# Patient Record
Sex: Male | Born: 1970 | Race: Black or African American | Hispanic: No | Marital: Married | Smoking: Never smoker
Health system: Southern US, Community
[De-identification: ages and names within clinical notes are randomized; demographics above are authoritative.]

## PROBLEM LIST (undated history)

## (undated) ENCOUNTER — Ambulatory Visit (HOSPITAL_COMMUNITY): Source: Home / Self Care

## (undated) DIAGNOSIS — G4730 Sleep apnea, unspecified: Secondary | ICD-10-CM

## (undated) DIAGNOSIS — E785 Hyperlipidemia, unspecified: Secondary | ICD-10-CM

## (undated) DIAGNOSIS — R7989 Other specified abnormal findings of blood chemistry: Secondary | ICD-10-CM

## (undated) DIAGNOSIS — K649 Unspecified hemorrhoids: Secondary | ICD-10-CM

## (undated) DIAGNOSIS — G4733 Obstructive sleep apnea (adult) (pediatric): Principal | ICD-10-CM

## (undated) DIAGNOSIS — T7840XA Allergy, unspecified, initial encounter: Secondary | ICD-10-CM

## (undated) DIAGNOSIS — I10 Essential (primary) hypertension: Secondary | ICD-10-CM

## (undated) HISTORY — DX: Obstructive sleep apnea (adult) (pediatric): G47.33

## (undated) HISTORY — DX: Allergy, unspecified, initial encounter: T78.40XA

## (undated) HISTORY — DX: Hyperlipidemia, unspecified: E78.5

## (undated) HISTORY — DX: Unspecified hemorrhoids: K64.9

## (undated) HISTORY — DX: Sleep apnea, unspecified: G47.30

## (undated) HISTORY — PX: COLONOSCOPY: SHX174

## (undated) HISTORY — DX: Other specified abnormal findings of blood chemistry: R79.89

## (undated) HISTORY — DX: Gilbert syndrome: E80.4

---

## 2006-12-14 ENCOUNTER — Emergency Department (HOSPITAL_COMMUNITY): Admission: EM | Admit: 2006-12-14 | Discharge: 2006-12-14 | Payer: Self-pay | Admitting: Emergency Medicine

## 2006-12-17 ENCOUNTER — Encounter (HOSPITAL_COMMUNITY): Admission: RE | Admit: 2006-12-17 | Discharge: 2007-03-17 | Payer: Self-pay | Admitting: Emergency Medicine

## 2007-01-11 ENCOUNTER — Emergency Department (HOSPITAL_COMMUNITY): Admission: EM | Admit: 2007-01-11 | Discharge: 2007-01-11 | Payer: Self-pay | Admitting: Emergency Medicine

## 2012-07-29 ENCOUNTER — Encounter: Payer: Self-pay | Admitting: Gastroenterology

## 2012-08-17 ENCOUNTER — Ambulatory Visit: Payer: Self-pay | Admitting: Gastroenterology

## 2012-08-24 ENCOUNTER — Encounter: Payer: Self-pay | Admitting: Gastroenterology

## 2012-08-24 ENCOUNTER — Ambulatory Visit (INDEPENDENT_AMBULATORY_CARE_PROVIDER_SITE_OTHER): Payer: 59 | Admitting: Gastroenterology

## 2012-08-24 VITALS — BP 124/74 | HR 76 | Ht 66.0 in | Wt 177.1 lb

## 2012-08-24 DIAGNOSIS — Z800 Family history of malignant neoplasm of digestive organs: Secondary | ICD-10-CM

## 2012-08-24 MED ORDER — PEG-KCL-NACL-NASULF-NA ASC-C 100 G PO SOLR: 1.0000 | Freq: Once | ORAL | Status: DC

## 2012-08-24 NOTE — Patient Instructions (Addendum)
You have been scheduled for a colonoscopy with propofol. Please follow written instructions given to you at your visit today.  Please pick up your prep kit at the pharmacy within the next 1-3 days. If you use inhalers (even only as needed), please bring them with you on the day of your procedure.  Thank you for choosing me and [facility].  Venita Lick. Pleas Koch., MD., Clementeen Graham  cc: Tracey Harries, MD

## 2012-08-24 NOTE — Progress Notes (Signed)
History of Present Illness: This is a 42 year old male with a family history of colon cancer. His father was diagnosed with colon cancer at age 43 and underwent a colostomy. Eventually he had metastatic disease to the liver and passed away. Patient is the youngest of 6 children and none of his siblings at colonoscopy. He notes a mild constipation since beginning Pravachol. He occasionally has some hemorrhoid symptoms of mild disomfort and swelling that responded quickly to Preparation H. Denies weight loss, abdominal pain, diarrhea, change in stool caliber, melena, hematochezia, nausea, vomiting, dysphagia, reflux symptoms, chest pain.  Review of Systems: Pertinent positive and negative review of systems were noted in the above HPI section. All other review of systems were otherwise negative.  Current Medications, Allergies, Past Medical History, Past Surgical History, Family History and Social History were reviewed in [employer] record.  Physical Exam: General: Well developed , well nourished, no acute distress Head: Normocephalic and atraumatic Eyes:  sclerae anicteric, EOMI Ears: Normal auditory acuity Mouth: No deformity or lesions Neck: Supple, no masses or thyromegaly Lungs: Clear throughout to auscultation Heart: Regular rate and rhythm; no murmurs, rubs or bruits Abdomen: Soft, non tender and non distended. No masses, hepatosplenomegaly or hernias noted. Normal Bowel sounds Rectal: Deferred to colonoscopy Musculoskeletal: Symmetrical with no gross deformities  Skin: No lesions on visible extremities Pulses:  Normal pulses noted Extremities: No clubbing, cyanosis, edema or deformities noted Neurological: Alert oriented x 4, grossly nonfocal Cervical Nodes:  No significant cervical adenopathy Inguinal Nodes: No significant inguinal adenopathy Psychological:  Alert and cooperative. Normal mood and affect  Assessment and Recommendations:  1. Family history of  colon cancer in his father at age 81. Schedule colonoscopy for higher risk colorectal cancer screening. I advised him to advise his siblings to proceed with colon cancer screening.  The risks, benefits, and alternatives to colonoscopy with possible biopsy and possible polypectomy were discussed with the patient and they consent to proceed.

## 2012-08-31 ENCOUNTER — Encounter: Payer: Self-pay | Admitting: Gastroenterology

## 2012-08-31 ENCOUNTER — Encounter: Payer: Self-pay | Admitting: *Deleted

## 2012-08-31 ENCOUNTER — Ambulatory Visit (AMBULATORY_SURGERY_CENTER): Payer: 59 | Admitting: Gastroenterology

## 2012-08-31 VITALS — BP 133/79 | HR 60 | Temp 96.8°F | Resp 21 | Ht 66.0 in | Wt 177.0 lb

## 2012-08-31 DIAGNOSIS — Z800 Family history of malignant neoplasm of digestive organs: Secondary | ICD-10-CM

## 2012-08-31 DIAGNOSIS — Z1211 Encounter for screening for malignant neoplasm of colon: Secondary | ICD-10-CM

## 2012-08-31 DIAGNOSIS — D126 Benign neoplasm of colon, unspecified: Secondary | ICD-10-CM

## 2012-08-31 MED ORDER — SODIUM CHLORIDE 0.9 % IV SOLN: 500.0000 mL | INTRAVENOUS | Status: DC

## 2012-08-31 NOTE — Progress Notes (Signed)
Called to room to assist during endoscopic procedure.  Patient ID and intended procedure confirmed with present staff. Received instructions for my participation in the procedure from the performing physician.  

## 2012-08-31 NOTE — Patient Instructions (Addendum)

## 2012-08-31 NOTE — Progress Notes (Signed)
Patient did not experience any of the following events: a burn prior to discharge; a fall within the facility; wrong site/side/patient/procedure/implant event; or a hospital transfer or hospital admission upon discharge from the facility. (G8907) Patient did not have preoperative order for IV antibiotic SSI prophylaxis. (G8918)  

## 2012-08-31 NOTE — Op Note (Signed)
[facility] [address] [place], [address]   COLONOSCOPY PROCEDURE REPORT  PATIENT: Vincent Rodriguez, Vincent Rodriguez  MR#: [id number] BIRTHDATE: April 29, 1971 , 41  yrs. old GENDER: Male ENDOSCOPIST: Meryl Dare, MD, [facility] REFERRED XB:JYNWG Bouska, M.D. PROCEDURE DATE:  08/31/2012 PROCEDURE:   Colonoscopy with snare polypectomy ASA CLASS:   Class II INDICATIONS:elevated risk screening and patient's immediate family history of colon cancer. MEDICATIONS: MAC sedation, administered by CRNA and propofol (Diprivan) 300mg  IV DESCRIPTION OF PROCEDURE:   After the risks benefits and alternatives of the procedure were thoroughly explained, informed consent was obtained.  A digital rectal exam revealed no abnormalities of the rectum.   The LB CF-[id number]  endoscope was introduced through the anus and advanced to the cecum, which was identified by both the appendix and ileocecal valve. No adverse events experienced.   The quality of the prep was excellent, using MoviPrep  The instrument was then slowly withdrawn as the colon was fully examined.  COLON FINDINGS: A sessile polyp measuring 7 mm in size was found at the cecum.  A polypectomy was performed with a cold snare.  The resection was complete and the polyp tissue was completely retrieved. The colon was otherwise normal. There was no diverticulosis, inflammation, polyps or cancers unless previously stated.  Retroflexed views revealed no abnormalities. The time to cecum=1 minutes 08 seconds.  Withdrawal time=8 minutes 18 seconds. The scope was withdrawn and the procedure completed.  COMPLICATIONS: There were no complications.  ENDOSCOPIC IMPRESSION: 1.   Sessile polyp measuring 7 mm found at the cecum; polypectomy performed with a cold snare 2.   The colon was otherwise normal  RECOMMENDATIONS: 1.  Await pathology results 2.  Repeat Colonoscopy in 5 years  eSigned:  Meryl Dare, MD, [facility] 08/31/2012 3:06  PM

## 2012-09-01 ENCOUNTER — Telehealth: Payer: Self-pay

## 2012-09-01 NOTE — Telephone Encounter (Signed)
  Follow up Call-  Call back number 08/31/2012  Post procedure Call Back phone  # [phone]  Permission to leave phone message Yes     Patient questions:  Do you have a fever, pain , or abdominal swelling? no Pain Score  0 *  Have you tolerated food without any problems? yes  Have you been able to return to your normal activities? yes  Do you have any questions about your discharge instructions: Diet   no Medications  no Follow up visit  no  Do you have questions or concerns about your Care? no  Actions: * If pain score is 4 or above: No action needed, pain <4.

## 2012-09-03 ENCOUNTER — Encounter: Payer: Self-pay | Admitting: Gastroenterology

## 2013-05-27 ENCOUNTER — Institutional Professional Consult (permissible substitution): Payer: Self-pay | Admitting: Neurology

## 2013-06-04 DIAGNOSIS — E291 Testicular hypofunction: Secondary | ICD-10-CM | POA: Insufficient documentation

## 2013-06-04 DIAGNOSIS — Z800 Family history of malignant neoplasm of digestive organs: Secondary | ICD-10-CM | POA: Insufficient documentation

## 2013-06-04 DIAGNOSIS — E785 Hyperlipidemia, unspecified: Secondary | ICD-10-CM | POA: Insufficient documentation

## 2013-06-11 ENCOUNTER — Encounter: Payer: Self-pay | Admitting: *Deleted

## 2013-06-11 ENCOUNTER — Encounter: Payer: Self-pay | Admitting: Neurology

## 2013-06-11 ENCOUNTER — Ambulatory Visit (INDEPENDENT_AMBULATORY_CARE_PROVIDER_SITE_OTHER): Payer: 59 | Admitting: Neurology

## 2013-06-11 ENCOUNTER — Encounter (INDEPENDENT_AMBULATORY_CARE_PROVIDER_SITE_OTHER): Payer: Self-pay

## 2013-06-11 VITALS — BP 136/98 | HR 72 | Temp 98.0°F | Ht 66.5 in | Wt 180.0 lb

## 2013-06-11 DIAGNOSIS — G4733 Obstructive sleep apnea (adult) (pediatric): Secondary | ICD-10-CM

## 2013-06-11 NOTE — Progress Notes (Signed)
Subjective:    Patient ID: Vincent Rodriguez is a 43 y.o. male.  HPI    Vincent Rodriguez [facility] [address] [place], [place] [address]  Dear Dr. Coletta Rodriguez,   I saw your patient, Vincent Rodriguez, upon your kind request in my neurologic clinic today for initial consultation of his sleep disorder, in particular, concern for obstructive sleep apnea. The patient is unaccompanied today. As you know, Vincent Rodriguez is a very friendly 43 year old right-handed gentleman with an underlying medical history of hyperlipidemia, who per wife is reported to snore and have apneic pauses while asleep. 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 He drives a Scientific laboratory technician. He is a non-smoker. He does not drink EtOH. He tries to sleep longer on WEs.   His typical bedtime is reported to be around 1 AM and usual wake time is around 5:40 AM. Sleep onset typically occurs within minutes. He reports feeling marginally rested upon awakening. He wakes up on an average 3 or 4 times in the middle of the night and has to go to the bathroom 2 times on a typical night. He admits to rare morning headaches.  He reports excessive daytime somnolence (EDS) and His Epworth Sleepiness Score (ESS) is 8/24 today. He has not fallen asleep while driving. The patient has not been taking a planned nap, but tries to nap on the weekend.  He has been known to snore for the past few years. Snoring is reportedly marked, and associated with choking sounds and witnessed apneas. The patient denies a sense of choking or strangling feeling. There is no report of nighttime reflux, with no nighttime cough experienced. The patient has not noted any RLS symptoms and is not known to kick while asleep or before falling asleep. There is no family history of RLS or OSA.  He is not a very restless  sleeper.   He denies cataplexy, sleep paralysis, hypnagogic or hypnopompic hallucinations, or sleep attacks. He does not report any vivid dreams, nightmares, dream enactments, or parasomnias, such as sleep talking or sleep walking. The patient has not had a sleep study or a home sleep test.  He consumes 0 caffeinated beverages per day.  His bedroom is usually dark and cool. There is no TV in the bedroom.   His Past Medical History Is Significant For: Past Medical History  Diagnosis Date  . HLD (hyperlipidemia)   . Gilbert syndrome   . Low testosterone   . Hemorrhoids     His Past Surgical History Is Significant For: History reviewed. No pertinent past surgical history.  His Family History Is Significant For: Family History  Problem Relation Age of Onset  . Colon cancer Father 67  . Liver cancer Father   . Stroke Mother   . Hypertension Mother   . Diabetes Brother     x 2  . Stroke Brother   . Hypertension Sister     His Social History Is Significant For: History   Social History  . Marital Status: Single    Spouse Name: N/A    Number of Children: 0  . Years of Education: N/A   Occupational History  . street [employer] Unemployed   Social History Main Topics  . Smoking status: Never Smoker   . Smokeless tobacco: Never Used  .  Alcohol Use: No  . Drug Use: No  . Sexual Activity: None   Other Topics Concern  . None   Social History Narrative  . None    His Allergies Are:  No Known Allergies:   His Current Medications Are:  Outpatient Encounter Prescriptions as of 06/11/2013  Medication Sig  . meloxicam (MOBIC) 15 MG tablet Take 1 tablet by mouth daily.  . Nutritional Supplements (CONCEPTIONXR REPRODUCTIVE) TABS Take 2 tablets by mouth 2 (two) times daily.  . pravastatin (PRAVACHOL) 80 MG tablet Take 80 mg by mouth daily.   . [DISCONTINUED] clomiPHENE (CLOMID) 50 MG tablet Take 50 mg by mouth daily.  . [DISCONTINUED] VIAGRA 50 MG tablet Take 50 mg by mouth  as needed.   :  Review of Systems:  Out of a complete 14 point review of systems, all are reviewed and negative with the exception of these symptoms as listed below:  Review of Systems  Constitutional: Negative.   HENT: Negative.   Eyes: Negative.   Respiratory:       Snoring  Cardiovascular: Negative.   Gastrointestinal: Negative.   Endocrine: Negative.   Genitourinary: Negative.   Musculoskeletal: Negative.   Skin: Negative.   Allergic/Immunologic: Negative.   Neurological: Negative.   Hematological: Negative.   Psychiatric/Behavioral: Sleep disturbance: snoring.    Objective:  Neurologic Exam  Physical Exam Physical Examination:   Filed Vitals:   06/11/13 0922  BP: 136/98  Pulse: 72  Temp: 98 F (36.7 C)    General Examination: The patient is a very pleasant 43 y.o. male in no acute distress. He appears well-developed and well-nourished and adequately groomed.   HEENT: Normocephalic, atraumatic, pupils are equal, round and reactive to light and accommodation. Funduscopic exam is normal with sharp disc margins noted. Extraocular tracking is good without limitation to gaze excursion or nystagmus noted. Normal smooth pursuit is noted. Hearing is grossly intact. Tympanic membranes are clear bilaterally. Face is symmetric with normal facial animation and normal facial sensation. Speech is clear with no dysarthria noted. There is no hypophonia. There is no lip, neck/head, jaw or voice tremor. Neck is supple with full range of passive and active motion. There are no carotid bruits on auscultation. Oropharynx exam reveals: mild mouth dryness, adequate dental hygiene and moderate airway crowding, due to wider uvula and longer tongue. Mallampati is class II. Tongue protrudes centrally and palate elevates symmetrically. Tonsils are 1+ in size. Neck size is 16.5 inches. He has a tiny overbite. Nasal inspection reveals no significant nasal mucosal bogginess or redness and no septal  deviation, but mild nasal passage tightness.   Chest: Clear to auscultation without wheezing, rhonchi or crackles noted.  Heart: S1+S2+0, regular and normal without murmurs, rubs or gallops noted.   Abdomen: Soft, non-tender and non-distended with normal bowel sounds appreciated on auscultation.  Extremities: There is no pitting edema in the distal lower extremities bilaterally. Pedal pulses are intact.  Skin: Warm and dry without trophic changes noted. There are no varicose veins.  Musculoskeletal: exam reveals no obvious joint deformities, tenderness or joint swelling or erythema.   Neurologically:  Mental status: The patient is awake, alert and oriented in all 4 spheres. His memory, attention, language and knowledge are appropriate. There is no aphasia, agnosia, apraxia or anomia. Speech is clear with normal prosody and enunciation. Thought process is linear. Mood is congruent and affect is normal.  Cranial nerves are as described above under HEENT exam. In addition, shoulder shrug is normal with equal  shoulder height noted. Motor exam: Normal bulk, strength and tone is noted. There is no drift, tremor or rebound. Romberg is negative. Reflexes are 2+ throughout. Toes are downgoing bilaterally. Fine motor skills are intact with normal finger taps, normal hand movements, normal rapid alternating patting, normal foot taps and normal foot agility.  Cerebellar testing shows no dysmetria or intention tremor on finger to nose testing. Heel to shin is unremarkable bilaterally. There is no truncal or gait ataxia.  Sensory exam is intact to light touch, pinprick, vibration, temperature sense in the upper and lower extremities.  Gait, station and balance are unremarkable. No veering to one side is noted. No leaning to one side is noted. Posture is age-appropriate and stance is narrow based. No problems turning are noted. He turns en bloc. Tandem walk is unremarkable. Intact toe and heel stance is noted.                Assessment and Plan:   In summary, Vincent Rodriguez is a very pleasant 43 y.o.-year old male with a history and physical exam somewhat concerning for obstructive sleep apnea (OSA).   I had a long chat with the patient about my findings and the diagnosis of OSA, its prognosis and treatment options. We talked about medical treatments and non-pharmacological approaches. I explained in particular the risks and ramifications of untreated moderate to severe OSA, especially with respect to developing cardiovascular disease down the Road, including congestive heart failure, difficult to treat hypertension, cardiac arrhythmias, or stroke. Even type 2 diabetes has in part been linked to untreated OSA. We talked about trying to maintain a healthy lifestyle in general, as well as the importance of weight control. I encouraged the patient to eat healthy, exercise daily and keep well hydrated, to keep a scheduled bedtime and wake time routine, to not skip any meals and eat healthy snacks in between meals.  I recommended the following at this time: sleep study with potential positive airway pressure titration.  I explained the sleep test procedure to the patient and also outlined possible surgical and non-surgical treatment options of OSA, including the use of a custom-made dental device, upper airway surgical options, such as pillar implants, radiofrequency surgery, tongue base surgery, and UPPP. I also explained the CPAP treatment option to the patient, who indicated that he would be willing to try CPAP if the need arises. I explained the importance of being compliant with PAP treatment, not only for insurance purposes but primarily to improve His symptoms, and for the patient's long term health benefit, including to reduce His cardiovascular risks. I answered all his questions today and the patient was in agreement. I would like to see him back after the sleep study is completed and encouraged him to call  with any interim questions, concerns, problems or updates.   Thank you very much for allowing me to participate in the care of this nice patient. If I can be of any further assistance to you please do not hesitate to call me at [phone].  Sincerely,   Vincent Rodriguez

## 2013-06-11 NOTE — Patient Instructions (Signed)

## 2013-07-15 ENCOUNTER — Ambulatory Visit (INDEPENDENT_AMBULATORY_CARE_PROVIDER_SITE_OTHER): Payer: 59

## 2013-07-15 DIAGNOSIS — G4733 Obstructive sleep apnea (adult) (pediatric): Secondary | ICD-10-CM

## 2013-07-15 DIAGNOSIS — R9431 Abnormal electrocardiogram [ECG] [EKG]: Secondary | ICD-10-CM

## 2013-07-28 ENCOUNTER — Telehealth: Payer: Self-pay | Admitting: Neurology

## 2013-07-28 NOTE — Telephone Encounter (Signed)
Please call and notify the patient that the recent sleep study did not show any significant degree of obstructive sleep apnea. He has an overall mild degree of obstructive sleep apnea, for which he is encouraged to try to lose weight and try to sleep off his back. Very mild EKG changes were seen as well. Please inform patient that I would like to go over the details of the study during a follow up appointment and if not already previously scheduled, arrange a followup appointment (please utilize a followu-up slot). Also, route or fax report to PCP and referring MD, if other than PCP.  Once you have spoken to patient, you can close this encounter.   Thanks,  Star Age, MD, PhD [facility])

## 2013-07-29 ENCOUNTER — Encounter: Payer: Self-pay | Admitting: *Deleted

## 2013-07-29 NOTE — Telephone Encounter (Signed)
I called and spoke with the patient about his recent sleep study results. I informed the patient that the study did not show any significant degree of obstructive sleep apnea. He has an overall mild degree of obstructive sleep apnea and that Dr. Rexene Alberts recommend he lose weight and try to sleep off his back. Patient EKG showed very mild changes. I informed the patient that Dr. Rexene Alberts would like to discuss the report in detail. Patient has scheduled his appointment on September 03, 2013 at 9:00. I will fax a copy of the report to Dr. Chales Abrahams office and mail a copy to the patient.

## 2013-09-03 ENCOUNTER — Encounter: Payer: Self-pay | Admitting: Neurology

## 2013-09-03 ENCOUNTER — Ambulatory Visit (INDEPENDENT_AMBULATORY_CARE_PROVIDER_SITE_OTHER): Payer: 59 | Admitting: Neurology

## 2013-09-03 ENCOUNTER — Encounter (INDEPENDENT_AMBULATORY_CARE_PROVIDER_SITE_OTHER): Payer: Self-pay

## 2013-09-03 VITALS — BP 132/84 | HR 68 | Temp 98.3°F | Ht 66.5 in

## 2013-09-03 DIAGNOSIS — G4733 Obstructive sleep apnea (adult) (pediatric): Secondary | ICD-10-CM | POA: Insufficient documentation

## 2013-09-03 DIAGNOSIS — E663 Overweight: Secondary | ICD-10-CM

## 2013-09-03 HISTORY — DX: Obstructive sleep apnea (adult) (pediatric): G47.33

## 2013-09-03 NOTE — Patient Instructions (Signed)
Continue on working on a healthy weight and drink more water.  Try to sleep on your sides and try to get about 7 1/2 to 8 hours of sleep.  You have only a very mild degree of sleep apnea and do not require additional treatment other than above.  I can see you back as needed.

## 2013-09-03 NOTE — Progress Notes (Signed)
Subjective:    Patient ID: Vincent Rodriguez is a 43 y.o. male.  HPI    Interim history:   Mr. Vincent Rodriguez is a very friendly 43 year old right-handed gentleman with an underlying medical history of hyperlipidemia, who presents for followup consultation after his recent sleep study. He is unaccompanied today. I first met him on 06/11/2013, which time he reported snoring per wife as well as apneic pauses while asleep. We proceeded with a sleep study. He had a baseline sleep study on 07/15/2013 and I went over his test results with him in detail today. Sleep efficiency was normal at 91.2% with a normal latency to sleep of 14.5 minutes and wake after sleep onset of 23.5 minutes with mild sleep fragmentation noted. He had a normal arousal index. He had an increased percentage of stage II sleep, absence of slow-wave sleep and a normal percentage of REM sleep with a prolonged REM latency of 167 minutes. Rare PACs were seen. He had mild to moderate intermittent snoring. Total AHI was borderline at 5.9 per hour, rising to 12.1 per hour in REM sleep and 9 per hour in the supine position. Baseline oxygen saturation was 95%, nadir was 89%.  Today, he reports, doing better. He sleeps better and quit his part-time job, has been able to play basketball, rests better and feels more energized during the day. He previously slept 4-5 hours, now he sleeps about 7 1/2 to 8 hours. He has no CP, no palpitations, no SOB. He has been able to lose some weight. He had a UTI recently.   His Past Medical History Is Significant For: Past Medical History  Diagnosis Date  . HLD (hyperlipidemia)   . Gilbert syndrome   . Low testosterone   . Hemorrhoids     His Past Surgical History Is Significant For: No past surgical history on file.  His Family History Is Significant For: Family History  Problem Relation Age of Onset  . Colon cancer Father 41  . Liver cancer Father   . Stroke Mother   . Hypertension Mother   .  Diabetes Brother     x 2  . Stroke Brother   . Hypertension Sister     His Social History Is Significant For: History   Social History  . Marital Status: Single    Spouse Name: N/A    Number of Children: 0  . Years of Education: N/A   Occupational History  . street [employer] Unemployed   Social History Main Topics  . Smoking status: Never Smoker   . Smokeless tobacco: Never Used  . Alcohol Use: No  . Drug Use: No  . Sexual Activity: None   Other Topics Concern  . None   Social History Narrative  . None    His Allergies Are:  No Known Allergies:   His Current Medications Are:  Outpatient Encounter Prescriptions as of 09/03/2013  Medication Sig  . meloxicam (MOBIC) 15 MG tablet Take 1 tablet by mouth daily.  . Nutritional Supplements (CONCEPTIONXR REPRODUCTIVE) TABS Take 2 tablets by mouth 2 (two) times daily.  . pravastatin (PRAVACHOL) 80 MG tablet Take 80 mg by mouth daily.   :  Review of Systems:  Out of a complete 14 point review of systems, all are reviewed and negative with the exception of these symptoms as listed below:  Review of Systems  Constitutional: Negative.   HENT: Negative.   Eyes: Negative.   Respiratory: Negative.   Cardiovascular: Negative.   Gastrointestinal: Negative.  Endocrine: Negative.   Genitourinary: Negative.   Musculoskeletal: Negative.   Skin: Negative.   Allergic/Immunologic: Negative.   Neurological: Negative.   Hematological: Negative.   Psychiatric/Behavioral: Negative.   All other systems reviewed and are negative.   Objective:  Neurologic Exam  Physical Exam Physical Examination:   Filed Vitals:   09/03/13 0858  BP: 132/84  Pulse: 68  Temp: 98.3 F (36.8 C)     General Examination: The patient is a very pleasant 43 y.o. male in no acute distress. He appears well-developed and well-nourished and adequately groomed.   HEENT: Normocephalic, atraumatic, pupils are equal, round and reactive to light and  accommodation. Funduscopic exam is normal with sharp disc margins noted. Extraocular tracking is good without limitation to gaze excursion or nystagmus noted. Normal smooth pursuit is noted. Hearing is grossly intact. Face is symmetric with normal facial animation and normal facial sensation. Speech is clear with no dysarthria noted. There is no hypophonia. There is no lip, neck/head, jaw or voice tremor. Neck is supple with full range of passive and active motion. There are no carotid bruits on auscultation. Oropharynx exam reveals: mild mouth dryness, adequate dental hygiene and moderate airway crowding, due to wider uvula and longer tongue. Mallampati is class II. Tongue protrudes centrally and palate elevates symmetrically. Tonsils are 1+ in size.   Chest: Clear to auscultation without wheezing, rhonchi or crackles noted.  Heart: S1+S2+0, regular and normal without murmurs, rubs or gallops noted.   Abdomen: Soft, non-tender and non-distended with normal bowel sounds appreciated on auscultation.  Extremities: There is no pitting edema in the distal lower extremities bilaterally. Pedal pulses are intact.  Skin: Warm and dry without trophic changes noted. There are no varicose veins.  Musculoskeletal: exam reveals no obvious joint deformities, tenderness or joint swelling or erythema.   Neurologically:  Mental status: The patient is awake, alert and oriented in all 4 spheres. His memory, attention, language and knowledge are appropriate. There is no aphasia, agnosia, apraxia or anomia. Speech is clear with normal prosody and enunciation. Thought process is linear. Mood is congruent and affect is normal.  Cranial nerves are as described above under HEENT exam. In addition, shoulder shrug is normal with equal shoulder height noted. Motor exam: Normal bulk, strength and tone is noted. There is no drift, tremor or rebound. Romberg is negative. Reflexes are 2+ throughout. Toes are downgoing bilaterally.  Fine motor skills are intact.  Cerebellar testing shows no dysmetria or intention tremor on finger to nose testing. There is no truncal or gait ataxia.  Sensory exam is intact to light touch, pinprick, vibration, temperature sense in the upper and lower extremities.  Gait, station and balance are unremarkable. No veering to one side is noted. No leaning to one side is noted. Posture is age-appropriate and stance is narrow based. No problems turning are noted. He turns en bloc. Tandem walk is unremarkable. Intact toe and heel stance is noted.               Assessment and Plan:   In summary, KAGE WILLMANN is a very pleasant 43 y.o.-year old male with an underlying medical history of hyperlipidemia, who presents for followup consultation after his recent sleep study. His sleep study was explained to him in detail today. I also provided him with a copy of that today. His exam is nonfocal. Essentially, he has borderline or very mild obstructive sleep apnea for which weight loss and staying off his bike should be enough  as far as treatment. He actually has been able to lose weight and since he stopped his second job he feels much better overall. He sleeps more hours and feels better rested, he has more daytime energy and his wife also reports that he does not have any problems breathing at night. At this juncture, I have advised him to continue to strive towards a better BMI of 25 or less. He is advised to drink more water. He is also advised to try to sleep on his sides. I can see him back on an as-needed basis. He was in agreement.

## 2013-11-20 ENCOUNTER — Encounter (HOSPITAL_COMMUNITY): Payer: Self-pay | Admitting: Emergency Medicine

## 2013-11-20 ENCOUNTER — Emergency Department (HOSPITAL_COMMUNITY)
Admission: EM | Admit: 2013-11-20 | Discharge: 2013-11-20 | Discharge status: Against Medical Advice | Payer: 59 | Attending: Emergency Medicine | Admitting: Emergency Medicine

## 2013-11-20 DIAGNOSIS — N390 Urinary tract infection, site not specified: Secondary | ICD-10-CM | POA: Insufficient documentation

## 2013-11-20 DIAGNOSIS — G4733 Obstructive sleep apnea (adult) (pediatric): Secondary | ICD-10-CM

## 2013-11-20 LAB — URINALYSIS, ROUTINE W REFLEX MICROSCOPIC
BILIRUBIN URINE: NEGATIVE
Glucose, UA: NEGATIVE mg/dL
Hgb urine dipstick: NEGATIVE
Ketones, ur: NEGATIVE mg/dL
Leukocytes, UA: NEGATIVE
NITRITE: NEGATIVE
Protein, ur: NEGATIVE mg/dL
Specific Gravity, Urine: 1.03 (ref 1.005–1.030)
UROBILINOGEN UA: 1 mg/dL (ref 0.0–1.0)
pH: 5.5 (ref 5.0–8.0)

## 2013-11-20 NOTE — ED Notes (Signed)
Pt told staff that he was going to leave because he felt better now. RN convinced pt to wait til he was seen by EDP before leaving. Pt agreed.

## 2013-11-20 NOTE — ED Notes (Signed)
EDP and RN went to room to assess pt. Pt was not in room. Pt gown on bed and monitor equipment lying on the bed. Pt nowhere to be found. Pt left without being seen by EDP

## 2013-11-20 NOTE — ED Notes (Signed)
The pt is c/o urinary frequency for approx one week.  Now he has back pain also.  He thinks he has a uti

## 2014-06-29 DIAGNOSIS — Z0000 Encounter for general adult medical examination without abnormal findings: Secondary | ICD-10-CM | POA: Insufficient documentation

## 2014-12-04 ENCOUNTER — Encounter (HOSPITAL_COMMUNITY): Payer: Self-pay | Admitting: Emergency Medicine

## 2014-12-04 ENCOUNTER — Emergency Department (HOSPITAL_COMMUNITY)
Admission: EM | Admit: 2014-12-04 | Discharge: 2014-12-04 | Disposition: A | Discharge status: Home / Self Care | Payer: 59 | Attending: Emergency Medicine | Admitting: Emergency Medicine

## 2014-12-04 DIAGNOSIS — Z8669 Personal history of other diseases of the nervous system and sense organs: Secondary | ICD-10-CM | POA: Insufficient documentation

## 2014-12-04 DIAGNOSIS — Z8719 Personal history of other diseases of the digestive system: Secondary | ICD-10-CM | POA: Insufficient documentation

## 2014-12-04 DIAGNOSIS — E785 Hyperlipidemia, unspecified: Secondary | ICD-10-CM | POA: Insufficient documentation

## 2014-12-04 DIAGNOSIS — Z79899 Other long term (current) drug therapy: Secondary | ICD-10-CM | POA: Diagnosis not present

## 2014-12-04 DIAGNOSIS — N4889 Other specified disorders of penis: Secondary | ICD-10-CM | POA: Diagnosis not present

## 2014-12-04 LAB — CBC WITH DIFFERENTIAL/PLATELET
Basophils Absolute: 0 10*3/uL (ref 0.0–0.1)
Basophils Relative: 0 % (ref 0–1)
Eosinophils Absolute: 0 10*3/uL (ref 0.0–0.7)
Eosinophils Relative: 1 % (ref 0–5)
HCT: 37.6 % — ABNORMAL LOW (ref 39.0–52.0)
HEMOGLOBIN: 13.5 g/dL (ref 13.0–17.0)
LYMPHS PCT: 40 % (ref 12–46)
Lymphs Abs: 1.7 10*3/uL (ref 0.7–4.0)
MCH: 30.9 pg (ref 26.0–34.0)
MCHC: 35.9 g/dL (ref 30.0–36.0)
MCV: 86 fL (ref 78.0–100.0)
MONO ABS: 0.3 10*3/uL (ref 0.1–1.0)
MONOS PCT: 8 % (ref 3–12)
NEUTROS ABS: 2.2 10*3/uL (ref 1.7–7.7)
Neutrophils Relative %: 51 % (ref 43–77)
Platelets: 133 10*3/uL — ABNORMAL LOW (ref 150–400)
RBC: 4.37 MIL/uL (ref 4.22–5.81)
RDW: 12.6 % (ref 11.5–15.5)
WBC: 4.2 10*3/uL (ref 4.0–10.5)

## 2014-12-04 LAB — URINALYSIS, ROUTINE W REFLEX MICROSCOPIC
Bilirubin Urine: NEGATIVE
GLUCOSE, UA: NEGATIVE mg/dL
Hgb urine dipstick: NEGATIVE
Ketones, ur: NEGATIVE mg/dL
LEUKOCYTES UA: NEGATIVE
Nitrite: NEGATIVE
PROTEIN: NEGATIVE mg/dL
Specific Gravity, Urine: 1.015 (ref 1.005–1.030)
UROBILINOGEN UA: 1 mg/dL (ref 0.0–1.0)
pH: 7.5 (ref 5.0–8.0)

## 2014-12-04 LAB — BASIC METABOLIC PANEL
Anion gap: 7 (ref 5–15)
BUN: 14 mg/dL (ref 6–20)
CALCIUM: 9 mg/dL (ref 8.9–10.3)
CO2: 28 mmol/L (ref 22–32)
Chloride: 105 mmol/L (ref 101–111)
Creatinine, Ser: 1.16 mg/dL (ref 0.61–1.24)
GFR calc non Af Amer: >60 mL/min (ref >60)
GLUCOSE: 103 mg/dL — AB (ref 65–99)
Potassium: 3.5 mmol/L (ref 3.5–5.1)
SODIUM: 140 mmol/L (ref 135–145)

## 2014-12-04 NOTE — ED Provider Notes (Signed)
CSN: [id number]     Arrival date & time 12/04/14  1428 History   First MD Initiated Contact with Patient 12/04/14 1631     Chief Complaint  Patient presents with  . Abdominal Pain  . Penis Pain   (Consider location/radiation/quality/duration/timing/severity/associated sxs/prior Treatment) HPI  Patient is a 44 year old male with a history of low testosterone, Gilbert syndrome, HLD presented today for swelling of the left side of the penis. Patient reports this began yesterday and has been intermittent. It began after he was swimming in a pool yesterday. He denies any trauma to his penis. Denies any discharge, dysuria, fevers, chills, nausea, vomiting. He has had lower abdominal pain but reports she did have exercises recently. Reports he is still passing gas and urinating without issue. Denies any numbness tingling. Reports he is on a new fertility medication and has follow-up with urology. Reports this is just soft tissue swelling of the left side of the shaft of the penis. He went to sleep last night and it had gone down. Awoke this morning and it had increased again. Denies any previous similar episodes.  Past Medical History  Diagnosis Date  . HLD (hyperlipidemia)   . Gilbert syndrome   . Low testosterone   . Hemorrhoids   . OSA (obstructive sleep apnea) 09/03/2013   History reviewed. No pertinent past surgical history. Family History  Problem Relation Age of Onset  . Colon cancer Father 47  . Liver cancer Father   . Stroke Mother   . Hypertension Mother   . Diabetes Brother     x 2  . Stroke Brother   . Hypertension Sister    History  Substance Use Topics  . Smoking status: Never Smoker   . Smokeless tobacco: Never Used  . Alcohol Use: No    Review of Systems  Constitutional: Negative for fever and chills.  HENT: Negative for congestion and sore throat.   Eyes: Negative for pain.  Respiratory: Negative for cough and shortness of breath.   Cardiovascular: Negative for  chest pain and palpitations.  Gastrointestinal: Positive for abdominal pain. Negative for nausea, vomiting and diarrhea.  Endocrine: Negative.   Genitourinary: Positive for penile swelling. Negative for dysuria, urgency, hematuria, flank pain, discharge, difficulty urinating, genital sores, penile pain and testicular pain.  Musculoskeletal: Negative for back pain and neck pain.  Skin: Negative for rash.  Allergic/Immunologic: Negative.   Neurological: Negative for dizziness, syncope and light-headedness.  Psychiatric/Behavioral: Negative for confusion.   Allergies  Shrimp  Home Medications   Prior to Admission medications   Medication Sig Start Date End Date Taking? Authorizing Provider  aspirin-acetaminophen-caffeine (EXCEDRIN MIGRAINE) [phone] MG per tablet Take 2 tablets by mouth daily as needed for headache.   Yes Historical Provider, MD  clomiPHENE (CLOMID) 50 MG tablet Take 50 mg by mouth daily. 10/07/14  Yes Historical Provider, MD  pravastatin (PRAVACHOL) 80 MG tablet Take 80 mg by mouth daily.  08/07/12  Yes Historical Provider, MD   BP 129/78 mmHg  Pulse 67  Temp(Src) 98.4 F (36.9 C) (Oral)  Resp 18  Wt 172 lb (78.019 kg)  SpO2 99% Physical Exam  Constitutional: He is oriented to person, place, and time. He appears well-developed and well-nourished.  HENT:  Head: Normocephalic and atraumatic.  Eyes: Conjunctivae and EOM are normal. Pupils are equal, round, and reactive to light.  Neck: Normal range of motion. Neck supple.  Cardiovascular: Normal rate, regular rhythm, normal heart sounds and intact distal pulses.   Pulmonary/Chest: Effort  normal and breath sounds normal. No respiratory distress.  Abdominal: Soft. Bowel sounds are normal. There is no tenderness. There is no rigidity, no rebound, no guarding, no CVA tenderness, no tenderness at McBurney's point and negative Murphy's sign. No hernia. Hernia confirmed negative in the right inguinal area and confirmed  negative in the left inguinal area.  Genitourinary: Testes normal. Cremasteric reflex is present. Right testis shows no mass, no swelling and no tenderness. Right testis is descended. Cremasteric reflex is not absent on the right side. No phimosis, paraphimosis, hypospadias, penile erythema or penile tenderness. No discharge found.  Swelling on the left side of the shaft of the penis. No tenderness palpation or surrounding erythema.  Musculoskeletal: Normal range of motion.  Neurological: He is alert and oriented to person, place, and time. He has normal reflexes. No cranial nerve deficit.  Skin: Skin is warm and dry.    ED Course  Procedures (including critical care time) Labs Review Labs Reviewed  CBC WITH DIFFERENTIAL/PLATELET - Abnormal; Notable for the following:    HCT 37.6 (*)    Platelets 133 (*)    All other components within normal limits  BASIC METABOLIC PANEL - Abnormal; Notable for the following:    Glucose, Bld 103 (*)    All other components within normal limits  URINE CULTURE  URINALYSIS, ROUTINE W REFLEX MICROSCOPIC (NOT AT [facility])    Imaging Review No results found.   EKG Interpretation None      MDM   Final diagnoses:  Swelling of penis   Patient is a 44 year old male with a history of low testosterone, given bare syndrome, HLD presented today for swelling of the left side of the penis.   DDX allergic reaction, STI, abscess, cellulitis.  On initial evaluation patient was hemodynamic stable and in no acute distress. Appears evaluated and found no hernia present. Also no frank erythema or fluctuance. Doubt cellulitis or abscess at this time. No ulceration or pattern consistent with STI. Possible inflammatory response or reaction as mild urticaria stranding. Advised patient to take Benadryl. No leukocytosis on labs with no UA abnormalities. Patient with urologist and advised to follow-up with him. A h all readyas appointment scheduled for next Friday and will  follow-up then.  If performed, labs, EKGs, and imaging were reviewed/interpreted by myself and my attending and incorporated into medical decision making.  Discussed pertinent finding with patient or caregiver prior to discharge with no further questions.  Immediate return precautions given and pt or caregiver reports understanding.  Pt care supervised by my attending Dr. Carollee Massed, MD PGY-2  Emergency Medicine     Geronimo Boot, MD 12/05/14 3299  Charlesetta Shanks, MD 12/19/14 1356

## 2014-12-04 NOTE — ED Notes (Signed)
Pt sts lower abd soreness after heavy workout 3 days ago; pt sts swollen area on side of penis x 2 days

## 2014-12-04 NOTE — ED Notes (Signed)
Dr. Liston Alba at bedside

## 2014-12-06 LAB — URINE CULTURE: Culture: NO GROWTH

## 2015-03-25 ENCOUNTER — Encounter (HOSPITAL_COMMUNITY): Payer: Self-pay | Admitting: Emergency Medicine

## 2015-03-25 ENCOUNTER — Emergency Department (HOSPITAL_COMMUNITY)
Admission: EM | Admit: 2015-03-25 | Discharge: 2015-03-25 | Disposition: A | Discharge status: Home / Self Care | Payer: 59 | Attending: Emergency Medicine | Admitting: Emergency Medicine

## 2015-03-25 DIAGNOSIS — R197 Diarrhea, unspecified: Secondary | ICD-10-CM | POA: Diagnosis not present

## 2015-03-25 DIAGNOSIS — Z79899 Other long term (current) drug therapy: Secondary | ICD-10-CM | POA: Diagnosis not present

## 2015-03-25 DIAGNOSIS — Z8669 Personal history of other diseases of the nervous system and sense organs: Secondary | ICD-10-CM | POA: Insufficient documentation

## 2015-03-25 DIAGNOSIS — K625 Hemorrhage of anus and rectum: Secondary | ICD-10-CM | POA: Diagnosis present

## 2015-03-25 DIAGNOSIS — E785 Hyperlipidemia, unspecified: Secondary | ICD-10-CM | POA: Insufficient documentation

## 2015-03-25 LAB — CBC WITH DIFFERENTIAL/PLATELET
Basophils Absolute: 0 10*3/uL (ref 0.0–0.1)
Basophils Relative: 0 %
EOS ABS: 0 10*3/uL (ref 0.0–0.7)
EOS PCT: 0 %
HEMATOCRIT: 41.6 % (ref 39.0–52.0)
HEMOGLOBIN: 14.7 g/dL (ref 13.0–17.0)
Lymphocytes Relative: 37 %
Lymphs Abs: 1.8 10*3/uL (ref 0.7–4.0)
MCH: 30.2 pg (ref 26.0–34.0)
MCHC: 35.3 g/dL (ref 30.0–36.0)
MCV: 85.6 fL (ref 78.0–100.0)
Monocytes Absolute: 0.4 10*3/uL (ref 0.1–1.0)
Monocytes Relative: 9 %
NEUTROS PCT: 54 %
Neutro Abs: 2.6 10*3/uL (ref 1.7–7.7)
Platelets: 147 10*3/uL — ABNORMAL LOW (ref 150–400)
RBC: 4.86 MIL/uL (ref 4.22–5.81)
RDW: 12.5 % (ref 11.5–15.5)
WBC: 4.9 10*3/uL (ref 4.0–10.5)

## 2015-03-25 LAB — BASIC METABOLIC PANEL
Anion gap: 6 (ref 5–15)
BUN: 15 mg/dL (ref 6–20)
CHLORIDE: 108 mmol/L (ref 101–111)
CO2: 27 mmol/L (ref 22–32)
Calcium: 9.3 mg/dL (ref 8.9–10.3)
Creatinine, Ser: 1.2 mg/dL (ref 0.61–1.24)
GFR calc non Af Amer: >60 mL/min (ref >60)
Glucose, Bld: 101 mg/dL — ABNORMAL HIGH (ref 65–99)
POTASSIUM: 3.9 mmol/L (ref 3.5–5.1)
SODIUM: 141 mmol/L (ref 135–145)

## 2015-03-25 LAB — POC OCCULT BLOOD, ED: Fecal Occult Bld: NEGATIVE

## 2015-03-25 MED ORDER — SODIUM CHLORIDE 0.9 % IV BOLUS (SEPSIS)
1000.0000 mL | Freq: Once | INTRAVENOUS | Status: AC
  Administered 2015-03-25: 1000 mL via INTRAVENOUS

## 2015-03-25 NOTE — ED Notes (Signed)
Pt reports onset at 0800 with diarrhea. Pt has another episode of diarrhea at 0900. A third episode of diarrhea around 1100 with drops of blood noted in toilet. This morning pt reports noted blood on tissue when he wiped. Pt denies N/V.

## 2015-03-25 NOTE — ED Provider Notes (Signed)
CSN: [id number]     Arrival date & time 03/25/15  0900 History   First MD Initiated Contact with Patient 03/25/15 0912     Chief Complaint  Patient presents with  . Blood In Stools  . Diarrhea     (Consider location/radiation/quality/duration/timing/severity/associated sxs/prior Treatment) Patient is a 44 y.o. male presenting with diarrhea.  Diarrhea Severity:  Moderate Onset quality:  Gradual Number of episodes:  3 Duration:  1 day Timing:  Constant Progression:  Unchanged Relieved by:  Nothing Worsened by:  Nothing tried Ineffective treatments:  None tried Associated symptoms: abdominal pain (cramping)   Associated symptoms: no chills, no recent cough, no fever, no headaches and no vomiting   Risk factors: suspect food intake (not sure ate out yesterday)   Risk factors: no recent antibiotic use and no sick contacts     Past Medical History  Diagnosis Date  . HLD (hyperlipidemia)   . Gilbert syndrome   . Low testosterone   . Hemorrhoids   . OSA (obstructive sleep apnea) 09/03/2013   History reviewed. No pertinent past surgical history. Family History  Problem Relation Age of Onset  . Colon cancer Father 67  . Liver cancer Father   . Stroke Mother   . Hypertension Mother   . Diabetes Brother     x 2  . Stroke Brother   . Hypertension Sister    Social History  Substance Use Topics  . Smoking status: Never Smoker   . Smokeless tobacco: Never Used  . Alcohol Use: No    Review of Systems  Constitutional: Negative for fever and chills.  HENT: Negative for sore throat.   Eyes: Negative for visual disturbance.  Respiratory: Negative for shortness of breath.   Cardiovascular: Negative for chest pain.  Gastrointestinal: Positive for abdominal pain (cramping) and diarrhea. Negative for vomiting.  Genitourinary: Negative for difficulty urinating.  Musculoskeletal: Negative for back pain and neck stiffness.  Skin: Negative for rash.  Neurological: Negative for  syncope and headaches.      Allergies  Shrimp  Home Medications   Prior to Admission medications   Medication Sig Start Date End Date Taking? Authorizing Provider  aspirin-acetaminophen-caffeine (EXCEDRIN MIGRAINE) [phone] MG per tablet Take 2 tablets by mouth daily as needed for headache.   Yes Historical Provider, MD  clomiPHENE (CLOMID) 50 MG tablet Take 50 mg by mouth daily. 10/07/14  Yes Historical Provider, MD  pravastatin (PRAVACHOL) 80 MG tablet Take 80 mg by mouth daily.  08/07/12   Historical Provider, MD   BP 121/85 mmHg  Pulse 73  Temp(Src) 98.9 F (37.2 C) (Oral)  Resp 18  Ht 5' 5.5" (1.664 m)  Wt 167 lb (75.751 kg)  BMI 27.36 kg/m2  SpO2 100% Physical Exam  Constitutional: He is oriented to person, place, and time. He appears well-developed and well-nourished. No distress.  HENT:  Head: Normocephalic and atraumatic.  Eyes: Conjunctivae and EOM are normal.  Neck: Normal range of motion.  Cardiovascular: Normal rate, regular rhythm, normal heart sounds and intact distal pulses.  Exam reveals no gallop and no friction rub.   No murmur heard. Pulmonary/Chest: Effort normal and breath sounds normal. No respiratory distress. He has no wheezes. He has no rales.  Abdominal: Soft. He exhibits no distension. There is no tenderness. There is no guarding.  Musculoskeletal: He exhibits no edema.  Neurological: He is alert and oriented to person, place, and time.  Skin: Skin is warm and dry. He is not diaphoretic.  Nursing note  and vitals reviewed.   ED Course  Procedures (including critical care time) Labs Review Labs Reviewed  CBC WITH DIFFERENTIAL/PLATELET - Abnormal; Notable for the following:    Platelets 147 (*)    All other components within normal limits  BASIC METABOLIC PANEL - Abnormal; Notable for the following:    Glucose, Bld 101 (*)    All other components within normal limits  POC OCCULT BLOOD, ED    Imaging Review No results found. I have  personally reviewed and evaluated these images and lab results as part of my medical decision-making.   EKG Interpretation None      MDM   Final diagnoses:  Diarrhea, unspecified type  Rectal bleeding, likely secondary to internal hemorrhoids   44 year old male with a history of hyperlipidemia, hemorrhoids and sleep apnea presents with concern of diarrhea beginning last night with bright red blood on tissue paper this morning. Patient hemodynamically stable, with history of hemorrhoids, and history of drops of bright red blood, and bright red blood on tissue paper in setting of diarrhea is most consistent with bleeding from hemorrhoids. Other possible etiologies include bloody diarrhea, diverticulosis although history does not suggest this. No hx of melena and doubt UGIB. Low suspicion for diverticulosis given patient does not have history of this and prior colonoscopies, and amount of bleeding sounds less consistent with this. His hemoglobin is within normal limits, vital signs are within normal limits. BMP checked because of diarrhea and showed normal electrolytes.  Rectal exam did not show signs of external hemorrhoids, did not show signs of bloody stool and hemoccult was negative. Discussed PCP follow-up, need for hydration/fiber and reasons to return to the emergency department.Patient discharged in stable condition with understanding of reasons to return.     Gareth Morgan, MD 03/26/15 [phone]

## 2016-04-03 ENCOUNTER — Encounter (HOSPITAL_COMMUNITY): Payer: Self-pay | Admitting: Emergency Medicine

## 2016-04-03 ENCOUNTER — Emergency Department (HOSPITAL_COMMUNITY)
Admission: EM | Admit: 2016-04-03 | Discharge: 2016-04-03 | Disposition: A | Discharge status: Home / Self Care | Payer: 59 | Attending: Emergency Medicine | Admitting: Emergency Medicine

## 2016-04-03 DIAGNOSIS — Z7982 Long term (current) use of aspirin: Secondary | ICD-10-CM | POA: Diagnosis not present

## 2016-04-03 DIAGNOSIS — S0993XA Unspecified injury of face, initial encounter: Secondary | ICD-10-CM | POA: Diagnosis present

## 2016-04-03 DIAGNOSIS — W228XXA Striking against or struck by other objects, initial encounter: Secondary | ICD-10-CM | POA: Insufficient documentation

## 2016-04-03 DIAGNOSIS — Y929 Unspecified place or not applicable: Secondary | ICD-10-CM | POA: Insufficient documentation

## 2016-04-03 DIAGNOSIS — Y9389 Activity, other specified: Secondary | ICD-10-CM | POA: Diagnosis not present

## 2016-04-03 DIAGNOSIS — Z23 Encounter for immunization: Secondary | ICD-10-CM | POA: Diagnosis not present

## 2016-04-03 DIAGNOSIS — Y990 Civilian activity done for income or pay: Secondary | ICD-10-CM | POA: Insufficient documentation

## 2016-04-03 DIAGNOSIS — S01511A Laceration without foreign body of lip, initial encounter: Secondary | ICD-10-CM | POA: Diagnosis not present

## 2016-04-03 MED ORDER — TETANUS-DIPHTH-ACELL PERTUSSIS 5-2.5-18.5 LF-MCG/0.5 IM SUSP
0.5000 mL | Freq: Once | INTRAMUSCULAR | Status: AC
  Administered 2016-04-03: 0.5 mL via INTRAMUSCULAR
  Filled 2016-04-03: qty 0.5

## 2016-04-03 NOTE — ED Triage Notes (Signed)
Pt was at work when a band strap to hold freight in came back and hit the patient in the face.

## 2016-04-03 NOTE — ED Notes (Addendum)
Pt was at work when a strap from some freight struck him in the lip. Pt has a small laceration to the right lower lip.

## 2016-04-03 NOTE — ED Provider Notes (Signed)
[facility] DEPT Provider Note   CSN: [id number] Arrival date & time: 04/03/16  1948     History   Chief Complaint Chief Complaint  Patient presents with  . Lip Laceration    HPI Vincent Rodriguez is a 45 y.o. male.  Pt presents to the ED with a lac to his right lower lip.  The pt said a strap hit him in the face while at work.  The pt said that he has not had a tetanus shot in over 5 years.      Past Medical History:  Diagnosis Date  . Gilbert syndrome   . Hemorrhoids   . HLD (hyperlipidemia)   . Low testosterone   . OSA (obstructive sleep apnea) 09/03/2013    Patient Active Problem List   Diagnosis Date Noted  . OSA (obstructive sleep apnea) 09/03/2013    No past surgical history on file.     Home Medications    Prior to Admission medications   Medication Sig Start Date End Date Taking? Authorizing Provider  aspirin-acetaminophen-caffeine (EXCEDRIN MIGRAINE) [phone] MG per tablet Take 2 tablets by mouth daily as needed for headache.    Historical Provider, MD  clomiPHENE (CLOMID) 50 MG tablet Take 50 mg by mouth daily. 10/07/14   Historical Provider, MD  pravastatin (PRAVACHOL) 80 MG tablet Take 80 mg by mouth daily.  08/07/12   Historical Provider, MD    Family History Family History  Problem Relation Age of Onset  . Colon cancer Father 70  . Liver cancer Father   . Stroke Mother   . Hypertension Mother   . Diabetes Brother     x 2  . Stroke Brother   . Hypertension Sister     Social History Social History  Substance Use Topics  . Smoking status: Never Smoker  . Smokeless tobacco: Never Used  . Alcohol use No     Allergies   Shrimp [shellfish allergy]   Review of Systems Review of Systems  HENT:       Lac to right lower lip  All other systems reviewed and are negative.    Physical Exam Updated Vital Signs BP 142/77 (BP Location: Right Arm)   Pulse 64   Temp 98.2 F (36.8 C) (Oral)   Resp 18   Ht 5\' 6"  (1.676 m)   Wt  169 lb (76.7 kg)   SpO2 100%   BMI 27.28 kg/m   Physical Exam  Constitutional: He appears well-developed and well-nourished.  HENT:  Head: Normocephalic.    Right Ear: External ear normal.  Left Ear: External ear normal.  Nose: Nose normal.  Mouth/Throat: Oropharynx is clear and moist.  Eyes: Conjunctivae and EOM are normal. Pupils are equal, round, and reactive to light.  Neck: Normal range of motion. Neck supple.  Cardiovascular: Normal rate, regular rhythm, normal heart sounds and intact distal pulses.   Pulmonary/Chest: Effort normal and breath sounds normal.  Abdominal: Soft. Bowel sounds are normal.  Nursing note and vitals reviewed.    ED Treatments / Results  Labs (all labs ordered are listed, but only abnormal results are displayed) Labs Reviewed - No data to display  EKG  EKG Interpretation None       Radiology No results found.  Procedures Procedures (including critical care time)  Medications Ordered in ED Medications - No data to display   Initial Impression / Assessment and Plan / ED Course  I have reviewed the triage vital signs and the nursing notes.  Pertinent labs & imaging results that were available during my care of the patient were reviewed by me and considered in my medical decision making (see chart for details).  Clinical Course    Wound is small.  Bleeding is controlled.  No need for sutures or dermabond.  Pt's tetanus shot updated.  Final Clinical Impressions(s) / ED Diagnoses   Final diagnoses:  Lip laceration, initial encounter    New Prescriptions New Prescriptions   No medications on file     Isla Pence, MD 04/03/16 2030

## 2016-10-03 ENCOUNTER — Encounter (HOSPITAL_COMMUNITY): Payer: Self-pay | Admitting: Emergency Medicine

## 2016-10-03 DIAGNOSIS — W01198A Fall on same level from slipping, tripping and stumbling with subsequent striking against other object, initial encounter: Secondary | ICD-10-CM | POA: Insufficient documentation

## 2016-10-03 DIAGNOSIS — Y9289 Other specified places as the place of occurrence of the external cause: Secondary | ICD-10-CM | POA: Diagnosis not present

## 2016-10-03 DIAGNOSIS — S8992XA Unspecified injury of left lower leg, initial encounter: Secondary | ICD-10-CM | POA: Diagnosis present

## 2016-10-03 DIAGNOSIS — Y9389 Activity, other specified: Secondary | ICD-10-CM | POA: Insufficient documentation

## 2016-10-03 DIAGNOSIS — Z7982 Long term (current) use of aspirin: Secondary | ICD-10-CM | POA: Insufficient documentation

## 2016-10-03 DIAGNOSIS — S8012XA Contusion of left lower leg, initial encounter: Secondary | ICD-10-CM | POA: Diagnosis not present

## 2016-10-03 DIAGNOSIS — Y990 Civilian activity done for income or pay: Secondary | ICD-10-CM | POA: Diagnosis not present

## 2016-10-03 DIAGNOSIS — Z79899 Other long term (current) drug therapy: Secondary | ICD-10-CM | POA: Diagnosis not present

## 2016-10-03 MED ORDER — OXYCODONE-ACETAMINOPHEN 5-325 MG PO TABS
1.0000 | ORAL_TABLET | Freq: Once | ORAL | Status: AC
  Administered 2016-10-03: 1 via ORAL

## 2016-10-03 MED ORDER — OXYCODONE-ACETAMINOPHEN 5-325 MG PO TABS
ORAL_TABLET | ORAL | Status: AC
  Administered 2016-10-03: 1 via ORAL
  Filled 2016-10-03: qty 1

## 2016-10-03 NOTE — ED Triage Notes (Signed)
Patient accidentally slipped and hit his left shin against a metal bar at work this evening , presents with left lower leg pain /bruise/swelling. Ambulatory . Pain increases with palpation and movement .

## 2016-10-04 ENCOUNTER — Emergency Department (HOSPITAL_COMMUNITY): Payer: Worker's Compensation

## 2016-10-04 ENCOUNTER — Emergency Department (HOSPITAL_COMMUNITY)
Admission: EM | Admit: 2016-10-04 | Discharge: 2016-10-04 | Disposition: A | Discharge status: Home / Self Care | Payer: Worker's Compensation | Attending: Emergency Medicine | Admitting: Emergency Medicine

## 2016-10-04 DIAGNOSIS — S8012XA Contusion of left lower leg, initial encounter: Secondary | ICD-10-CM

## 2016-10-04 MED ORDER — IBUPROFEN 600 MG PO TABS: 600.0000 mg | ORAL_TABLET | Freq: Four times a day (QID) | ORAL | 0 refills | Status: DC | PRN

## 2016-10-04 NOTE — ED Provider Notes (Signed)
[facility] DEPT Provider Note   CSN: [id number] Arrival date & time: 10/03/16  2322     History   Chief Complaint Chief Complaint  Patient presents with  . Leg Injury    Workmans Comp.    HPI Vincent Rodriguez is a 46 y.o. male.  HPI   Vincent Rodriguez is a 46 y.o. male, presenting to the ED with left lower leg injury that occurred last evening. Patient states he lost his balance and fell with his left lower leg against a steel beam. Patient has been ambulatory. Denies neurologic deficits, head injury, neck/back pain, or any other complaints.   Past Medical History:  Diagnosis Date  . Gilbert syndrome   . Hemorrhoids   . HLD (hyperlipidemia)   . Low testosterone   . OSA (obstructive sleep apnea) 09/03/2013    Patient Active Problem List   Diagnosis Date Noted  . OSA (obstructive sleep apnea) 09/03/2013    History reviewed. No pertinent surgical history.     Home Medications    Prior to Admission medications   Medication Sig Start Date End Date Taking? Authorizing Provider  aspirin-acetaminophen-caffeine (EXCEDRIN MIGRAINE) [phone] MG per tablet Take 2 tablets by mouth daily as needed for headache.    [provider]  clomiPHENE (CLOMID) 50 MG tablet Take 50 mg by mouth daily. 10/07/14   [provider]  ibuprofen (ADVIL,MOTRIN) 600 MG tablet Take 1 tablet (600 mg total) by mouth every 6 (six) hours as needed. 10/04/16   Jarrius Huaracha C, PA-C  pravastatin (PRAVACHOL) 80 MG tablet Take 80 mg by mouth daily.  08/07/12   [provider]    Family History Family History  Problem Relation Age of Onset  . Colon cancer Father 48  . Liver cancer Father   . Stroke Mother   . Hypertension Mother   . Diabetes Brother        x 2  . Stroke Brother   . Hypertension Sister     Social History Social History  Substance Use Topics  . Smoking status: Never Smoker  . Smokeless tobacco: Never Used  . Alcohol use No     Allergies     Shrimp [shellfish allergy]   Review of Systems Review of Systems  Musculoskeletal: Positive for myalgias.  Skin: Positive for color change. Negative for wound.  Neurological: Negative for weakness and numbness.     Physical Exam Updated Vital Signs BP (!) 146/87 (BP Location: Right Arm)   Pulse (!) 58   Temp 98.1 F (36.7 C) (Oral)   Resp 18   Ht 5\' 6"  (1.676 m)   Wt 80.3 kg (177 lb)   SpO2 100%   BMI 28.57 kg/m   Physical Exam  Constitutional: He appears well-developed and well-nourished. No distress.  HENT:  Head: Normocephalic and atraumatic.  Eyes: Conjunctivae are normal.  Neck: Neck supple.  Cardiovascular: Normal rate, regular rhythm and intact distal pulses.   Pulmonary/Chest: Effort normal.  Musculoskeletal: He exhibits edema and tenderness. He exhibits no deformity.  Swelling, tenderness, and erythema to the anterior left tibial region. No noted instability, deformity, or crepitus. Patient ambulatory with antalgic gait.  Neurological: He is alert.  No sensory deficits in the bilateral lower extremities. Strength 5 out of 5 in the bilateral knees and ankles.  Skin: Skin is warm and dry. He is not diaphoretic.  Psychiatric: He has a normal mood and affect. His behavior is normal.  Nursing note and vitals reviewed.  ED Treatments / Results  Labs (all labs ordered are listed, but only abnormal results are displayed) Labs Reviewed - No data to display  EKG  EKG Interpretation None       Radiology Dg Tibia/fibula Left  Result Date: 10/04/2016 CLINICAL DATA:  Slipped and hit left shin against metal bar, with pain, bruising and swelling. Initial encounter. EXAM: LEFT TIBIA AND FIBULA - 2 VIEW COMPARISON:  None. FINDINGS: There is no evidence of fracture or dislocation. The tibia and fibula appear grossly intact. The knee joint is incompletely assessed, but appears grossly unremarkable. The ankle mortise is incompletely characterized, but also  unremarkable in appearance. No definite soft tissue abnormalities are characterized on radiograph. IMPRESSION: No evidence of fracture or dislocation. Electronically Signed   By: Garald Balding M.D.   On: 10/04/2016 01:13    Procedures Procedures (including critical care time)  Medications Ordered in ED Medications  oxyCODONE-acetaminophen (PERCOCET/ROXICET) 5-325 MG per tablet 1 tablet (1 tablet Oral Given 10/03/16 2338)     Initial Impression / Assessment and Plan / ED Course  I have reviewed the triage vital signs and the nursing notes.  Pertinent labs & imaging results that were available during my care of the patient were reviewed by me and considered in my medical decision making (see chart for details).     Patient presents with left lower leg injury. No acute abnormality on x-ray. The patient was given instructions for home care as well as return precautions. Patient voices understanding of these instructions, accepts the plan, and is comfortable with discharge.  Final Clinical Impressions(s) / ED Diagnoses   Final diagnoses:  Contusion of left lower leg, initial encounter    New Prescriptions Discharge Medication List as of 10/04/2016  5:17 AM    START taking these medications   Details  ibuprofen (ADVIL,MOTRIN) 600 MG tablet Take 1 tablet (600 mg total) by mouth every 6 (six) hours as needed., Starting Fri 10/04/2016, Print         Marissa Weaver, [place] C, PA-C 10/04/16 [place], Delice Bison, DO 10/04/16 [phone]

## 2016-10-04 NOTE — Discharge Instructions (Signed)
You have been seen today for a lower leg injury. There were no acute abnormalities on the x-rays, including no sign of fracture or dislocation. Pain: Take 600 mg ibuprofen every 6 hours for the next 3 days. May take ibuprofen or naproxen as needed to reduce pain and inflammation. Take these types of medications with food to avoid upset stomach. Choose one of these medications, but not both.  Ice: May apply ice to the area over the next 24 hours for 15 minutes at a time to reduce swelling. Elevation: Keep the extremity elevated as often as possible to reduce pain and inflammation. Exercises: Start by performing these exercises a few times a week, increasing the frequency until you are performing them twice daily.  Follow up: If symptoms are improving, you may follow up with your primary care provider for any continued management. If symptoms are not improving, you may follow up with the orthopedic specialist. If symptoms are not improving, you may need a repeat xray.

## 2017-03-20 ENCOUNTER — Encounter (HOSPITAL_COMMUNITY): Payer: Self-pay

## 2017-03-20 ENCOUNTER — Other Ambulatory Visit: Payer: Self-pay

## 2017-03-20 ENCOUNTER — Emergency Department (HOSPITAL_COMMUNITY)
Admission: EM | Admit: 2017-03-20 | Discharge: 2017-03-20 | Disposition: A | Discharge status: Home / Self Care | Payer: 59 | Attending: Emergency Medicine | Admitting: Emergency Medicine

## 2017-03-20 DIAGNOSIS — M545 Low back pain, unspecified: Secondary | ICD-10-CM

## 2017-03-20 DIAGNOSIS — Z79899 Other long term (current) drug therapy: Secondary | ICD-10-CM | POA: Insufficient documentation

## 2017-03-20 MED ORDER — LIDOCAINE 5 % EX PTCH: 1.0000 | MEDICATED_PATCH | CUTANEOUS | 0 refills | Status: DC

## 2017-03-20 MED ORDER — IBUPROFEN 800 MG PO TABS: 800.0000 mg | ORAL_TABLET | Freq: Three times a day (TID) | ORAL | 0 refills | Status: DC

## 2017-03-20 MED ORDER — CYCLOBENZAPRINE HCL 10 MG PO TABS: 10.0000 mg | ORAL_TABLET | Freq: Two times a day (BID) | ORAL | 0 refills | Status: DC | PRN

## 2017-03-20 NOTE — ED Provider Notes (Signed)
[facility] EMERGENCY DEPARTMENT Provider Note   CSN: [id number] Arrival date & time: 03/20/17  1754     History   Chief Complaint Chief Complaint  Patient presents with  . Back Pain    HPI Vincent Rodriguez is a 46 y.o. male.  HPI   Vincent Rodriguez is a 46 year old male with a history of OSA, hyperlipidemia who presents the emergency department for evaluation of lower back pain.  Patient states that he was lifting a lawn more this morning when he felt his back "pull."  States that he has had a 4/10 severity, constant midline lower back pain which feels "sore" in nature.  It does not radiate.  Pain is worsened with sitting for long periods.  He tried icing the lower back with some relief.  He also tried 800 mg ibuprofen which helped with his pain.  He denies fever, night sweats, loss of appetite, numbness, weakness, saddle anesthesia, loss of bowel or bladder control, urinary retention, abdominal pain, nausea/vomiting, flank pain, dysuria.  He denies previous history of back surgery.  He denies history of cancer and IV drug use.  He is able to ambulate independently although painful.  Past Medical History:  Diagnosis Date  . Gilbert syndrome   . Hemorrhoids   . HLD (hyperlipidemia)   . Low testosterone   . OSA (obstructive sleep apnea) 09/03/2013    Patient Active Problem List   Diagnosis Date Noted  . OSA (obstructive sleep apnea) 09/03/2013    History reviewed. No pertinent surgical history.     Home Medications    Prior to Admission medications   Medication Sig Start Date End Date Taking? Authorizing Provider  aspirin-acetaminophen-caffeine (EXCEDRIN MIGRAINE) [phone] MG per tablet Take 2 tablets by mouth daily as needed for headache.    [provider]  clomiPHENE (CLOMID) 50 MG tablet Take 50 mg by mouth daily. 10/07/14   [provider]  cyclobenzaprine (FLEXERIL) 10 MG tablet Take 1 tablet (10 mg total) 2 (two) times daily as  needed by mouth for muscle spasms. 03/20/17   Glyn Ade, PA-C  ibuprofen (ADVIL,MOTRIN) 600 MG tablet Take 1 tablet (600 mg total) by mouth every 6 (six) hours as needed. 10/04/16   Joy, Shawn C, PA-C  ibuprofen (ADVIL,MOTRIN) 800 MG tablet Take 1 tablet (800 mg total) 3 (three) times daily by mouth. 03/20/17   Glyn Ade, PA-C  lidocaine (LIDODERM) 5 % Place 1 patch daily onto the skin. Remove & Discard patch within 12 hours or as directed by MD 03/20/17   Glyn Ade, PA-C  pravastatin (PRAVACHOL) 80 MG tablet Take 80 mg by mouth daily.  08/07/12   [provider]    Family History Family History  Problem Relation Age of Onset  . Colon cancer Father 55  . Liver cancer Father   . Stroke Mother   . Hypertension Mother   . Diabetes Brother        x 2  . Stroke Brother   . Hypertension Sister     Social History Social History   Tobacco Use  . Smoking status: Never Smoker  . Smokeless tobacco: Never Used  Substance Use Topics  . Alcohol use: No  . Drug use: No     Allergies   Shrimp [shellfish allergy]   Review of Systems Review of Systems  Constitutional: Negative for chills, fatigue, fever and unexpected weight change.  Gastrointestinal: Negative for abdominal pain, nausea and vomiting.  Genitourinary: Negative  for difficulty urinating, flank pain and hematuria.  Musculoskeletal: Positive for back pain. Negative for gait problem and neck pain.  Skin: Negative for rash and wound.  Neurological: Negative for weakness and numbness.     Physical Exam Updated Vital Signs BP (!) 145/101 (BP Location: Right Arm)   Pulse 66   Temp 99.1 F (37.3 C) (Oral)   Resp 18   Ht 5\' 6"  (1.676 m)   Wt 77.6 kg (171 lb)   SpO2 100%   BMI 27.60 kg/m   Physical Exam  Constitutional: He is oriented to person, place, and time. He appears well-developed and well-nourished. No distress.  HENT:  Head: Normocephalic and atraumatic.  Eyes: Right eye  exhibits no discharge. Left eye exhibits no discharge.  Neck: Normal range of motion. Neck supple.  No midline cervical spine tenderness.  Pulmonary/Chest: Effort normal. No respiratory distress.  Abdominal: Soft. Bowel sounds are normal. He exhibits no distension. There is no tenderness. There is no guarding.  No CVA tenderness.  No pulsatile mass.  Musculoskeletal:  Mild tenderness to palpation over the midline lower lumbar spine and bilateral paraspinal muscles.  No step-off or deformity. No overlying erythema, edema, ecchymoses.  No tenderness over the thoracic spine.  Strength 5/5 in bilateral ankles and knees.  DP pulses 2+ bilaterally.  Neurological: He is alert and oriented to person, place, and time. Coordination normal.  Distal sensation to light/sharp touch intact in bilateral lower extremities.  Patellar reflex 2+ bilaterally.  Gait normal and coordination and balance.  Skin: Skin is warm and dry. Capillary refill takes less than 2 seconds. He is not diaphoretic.  Psychiatric: He has a normal mood and affect. His behavior is normal.  Nursing note and vitals reviewed.    ED Treatments / Results  Labs (all labs ordered are listed, but only abnormal results are displayed) Labs Reviewed - No data to display  EKG  EKG Interpretation None       Radiology No results found.  Procedures Procedures (including critical care time)  Medications Ordered in ED Medications - No data to display   Initial Impression / Assessment and Plan / ED Course  I have reviewed the triage vital signs and the nursing notes.  Pertinent labs & imaging results that were available during my care of the patient were reviewed by me and considered in my medical decision making (see chart for details).     Patient with back pain. No neurological deficits and normal neuro exam.  Patient can walk but states is painful. No loss of bowel or bladder control. No concern for cauda equina.  No fever, night  sweats, weight loss, h/o cancer, IVDU. RICE protocol and pain medicine indicated and discussed with patient.  Patient's blood pressure was also elevated while he was in the ER today.  Discussed having this rechecked at his primary doctor's office.  Counseled patient on return precautions.  Patient agrees and voiced understanding with the above plan.  Final Clinical Impressions(s) / ED Diagnoses   Final diagnoses:  Acute midline low back pain without sciatica    ED Discharge Orders        Ordered    cyclobenzaprine (FLEXERIL) 10 MG tablet  2 times daily PRN     03/20/17 1902    lidocaine (LIDODERM) 5 %  Every 24 hours     03/20/17 1902    ibuprofen (ADVIL,MOTRIN) 800 MG tablet  3 times daily     03/20/17 1904  Glyn Ade, PA-C 03/20/17 1904    Merrily Pew, MD 03/21/17 0021

## 2017-03-20 NOTE — ED Triage Notes (Signed)
Pt presents with onset of low back pain while lifting lawn mower this morning.  Pt reports pain does not radiate, will get better when he moves around.  Took 800mg  ibuprofen with some relief.  Denies any bowel or bladder incontinence.

## 2017-03-20 NOTE — ED Notes (Signed)
Pt ambulates with steady gait and with no assistance needed.

## 2017-03-20 NOTE — Discharge Instructions (Signed)
Your symptoms are consistent with muscular back pain.  A prescription for Flexeril.  This is a muscle relaxer and can make you drowsy.  Please do not work, drive or drink alcohol while taking this medication.  I have also written a prescription for a lidocaine patch which you can place on the back daily to help relieve your symptoms.  Please take 800 mg ibuprofen every 6 hours for pain and inflammation.  Continue to place ice on the lower back twice a day tomorrow.  After tomorrow you can place heat over the back for relief.  Please schedule an appoint with your primary doctor if your symptoms are not improving in a week.  Return to the emergency department if you have back pain with fever greater than 100.4 F, back pain with numbness in which you cannot fill your feet, back pain in which you lose bladder or bowel control.  Please also return for any new or worsening symptoms.

## 2017-08-11 ENCOUNTER — Encounter: Payer: Self-pay | Admitting: Gastroenterology

## 2017-08-15 ENCOUNTER — Encounter: Payer: Self-pay | Admitting: Gastroenterology

## 2017-10-03 ENCOUNTER — Ambulatory Visit (AMBULATORY_SURGERY_CENTER): Payer: 59

## 2017-10-03 VITALS — Ht 65.5 in | Wt 170.2 lb

## 2017-10-03 DIAGNOSIS — Z86010 Personal history of colonic polyps: Secondary | ICD-10-CM

## 2017-10-03 DIAGNOSIS — Z800 Family history of malignant neoplasm of digestive organs: Secondary | ICD-10-CM

## 2017-10-03 MED ORDER — NA SULFATE-K SULFATE-MG SULF 17.5-3.13-1.6 GM/177ML PO SOLN: 1.0000 | Freq: Once | ORAL | 0 refills | Status: AC

## 2017-10-03 NOTE — Progress Notes (Signed)
Per pt, no allergies to soy or egg products.Pt not taking any weight loss meds or using  O2 at home.  Pt refused emmi video. 

## 2017-10-13 ENCOUNTER — Encounter: Payer: Self-pay | Admitting: Gastroenterology

## 2017-10-17 ENCOUNTER — Encounter: Payer: Self-pay | Admitting: Gastroenterology

## 2017-10-17 ENCOUNTER — Other Ambulatory Visit: Payer: Self-pay

## 2017-10-17 ENCOUNTER — Ambulatory Visit (AMBULATORY_SURGERY_CENTER): Payer: 59 | Admitting: Gastroenterology

## 2017-10-17 VITALS — BP 129/83 | HR 62 | Temp 97.3°F | Resp 11 | Ht 65.5 in | Wt 170.0 lb

## 2017-10-17 DIAGNOSIS — Z800 Family history of malignant neoplasm of digestive organs: Secondary | ICD-10-CM

## 2017-10-17 DIAGNOSIS — Z86010 Personal history of colonic polyps: Secondary | ICD-10-CM

## 2017-10-17 MED ORDER — SODIUM CHLORIDE 0.9 % IV SOLN
500.0000 mL | Freq: Once | INTRAVENOUS | Status: AC
Start: 2017-10-17 — End: ?

## 2017-10-17 NOTE — Progress Notes (Signed)
Pt's states no medical or surgical changes since previsit or office visit.Pt's states no medical or surgical changes since previsit or office visit. 

## 2017-10-17 NOTE — Op Note (Signed)
[facility] Patient Name: Vincent Rodriguez Procedure Date: 10/17/2017 11:20 AM MRN: [id number] Endoscopist: Ladene Artist , MD Age: 47 Referring MD:  Date of Birth: April 01, 1971 Gender: Male Account #: [id number] Procedure:                Colonoscopy Indications:              Surveillance: Personal history of adenomatous                            polyps on last colonoscopy 5 years ago. Family                            history of colon cancer. Medicines:                Monitored Anesthesia Care Procedure:                Pre-Anesthesia Assessment:                           - Prior to the procedure, a History and Physical                            was performed, and patient medications and                            allergies were reviewed. The patient's tolerance of                            previous anesthesia was also reviewed. The risks                            and benefits of the procedure and the sedation                            options and risks were discussed with the patient.                            All questions were answered, and informed consent                            was obtained. Prior Anticoagulants: The patient has                            taken no previous anticoagulant or antiplatelet                            agents. ASA Grade Assessment: II - A patient with                            mild systemic disease. After reviewing the risks                            and benefits, the patient was deemed in  satisfactory condition to undergo the procedure.                           After obtaining informed consent, the colonoscope                            was passed under direct vision. Throughout the                            procedure, the patient's blood pressure, pulse, and                            oxygen saturations were monitored continuously. The                            Model PCF-[id number]) scope was  introduced                            through the anus and advanced to the the cecum,                            identified by appendiceal orifice and ileocecal                            valve. The ileocecal valve, appendiceal orifice,                            and rectum were photographed. The quality of the                            bowel preparation was good. The colonoscopy was                            performed without difficulty. The patient tolerated                            the procedure well. Scope In: 11:26:46 AM Scope Out: 11:36:40 AM Scope Withdrawal Time: 0 hours 8 minutes 30 seconds  Total Procedure Duration: 0 hours 9 minutes 54 seconds  Findings:                 The perianal and digital rectal examinations were                            normal.                           The entire examined colon appeared normal on direct                            and retroflexion views. Complications:            No immediate complications. Estimated blood loss:                            None. Estimated Blood Loss:  Estimated blood loss: none. Impression:               - The entire examined colon is normal on direct and                            retroflexion views.                           - No specimens collected. Recommendation:           - Repeat colonoscopy in 5 years for surveillance.                           - Patient has a contact number available for                            emergencies. The signs and symptoms of potential                            delayed complications were discussed with the                            patient. Return to normal activities tomorrow.                            Written discharge instructions were provided to the                            patient.                           - Resume previous diet.                           - Continue present medications. Ladene Artist, MD 10/17/2017 11:39:47 AM This report has been signed  electronically.

## 2017-10-17 NOTE — Patient Instructions (Signed)
YOU HAD AN ENDOSCOPIC PROCEDURE TODAY AT THE [facility]:   Refer to the procedure report that was given to you for any specific questions about what was found during the examination.  If the procedure report does not answer your questions, please call your gastroenterologist to clarify.  If you requested that your care partner not be given the details of your procedure findings, then the procedure report has been included in a sealed envelope for you to review at your convenience later.  YOU SHOULD EXPECT: Some feelings of bloating in the abdomen. Passage of more gas than usual.  Walking can help get rid of the air that was put into your GI tract during the procedure and reduce the bloating. If you had a lower endoscopy (such as a colonoscopy or flexible sigmoidoscopy) you may notice spotting of blood in your stool or on the toilet paper. If you underwent a bowel prep for your procedure, you may not have a normal bowel movement for a few days.  Please Note:  You might notice some irritation and congestion in your nose or some drainage.  This is from the oxygen used during your procedure.  There is no need for concern and it should clear up in a day or so.  SYMPTOMS TO REPORT IMMEDIATELY:   Following lower endoscopy (colonoscopy or flexible sigmoidoscopy):  Excessive amounts of blood in the stool  Significant tenderness or worsening of abdominal pains  Swelling of the abdomen that is new, acute  Fever of 100F or higher  For urgent or emergent issues, a gastroenterologist can be reached at any hour by calling [phone].   DIET:  We do recommend a small meal at first, but then you may proceed to your regular diet.  Drink plenty of fluids but you should avoid alcoholic beverages for 24 hours.  ACTIVITY:  You should plan to take it easy for the rest of today and you should NOT DRIVE or use heavy machinery until tomorrow (because of the sedation medicines used during the test).     FOLLOW UP: Our staff will call the number listed on your records the next business day following your procedure to check on you and address any questions or concerns that you may have regarding the information given to you following your procedure. If we do not reach you, we will leave a message.  However, if you are feeling well and you are not experiencing any problems, there is no need to return our call.  We will assume that you have returned to your regular daily activities without incident.  If any biopsies were taken you will be contacted by phone or by letter within the next 1-3 weeks.  Please call us at [phone] if you have not heard about the biopsies in 3 weeks.    SIGNATURES/CONFIDENTIALITY: You and/or your care partner have signed paperwork which will be entered into your electronic medical record.  These signatures attest to the fact that that the information above on your After Visit Summary has been reviewed and is understood.  Full responsibility of the confidentiality of this discharge information lies with you and/or your care-partner. 

## 2017-10-17 NOTE — Progress Notes (Signed)
Report to PACU, RN, vss, BBS= Clear.  

## 2017-10-20 ENCOUNTER — Telehealth: Payer: Self-pay

## 2017-10-20 ENCOUNTER — Telehealth: Payer: Self-pay | Admitting: *Deleted

## 2017-10-20 NOTE — Telephone Encounter (Signed)
  Follow up Call-  Call back number 10/17/2017  Post procedure Call Back phone  # [phone]  Permission to leave phone message Yes  Some recent data might be hidden     LMOM.  Advised on voicemail the intent of the call and we will be trying again to reach him this afternoon. Angela/Call-back L[facility]

## 2017-10-20 NOTE — Telephone Encounter (Signed)
  Follow up Call-  Call back number 10/17/2017  Post procedure Call Back phone  # [phone]  Permission to leave phone message Yes  Some recent data might be hidden     Patient questions:  Message left to call us if necessary.  Second call.

## 2020-04-15 ENCOUNTER — Other Ambulatory Visit: Payer: Self-pay

## 2020-04-15 ENCOUNTER — Encounter (HOSPITAL_COMMUNITY): Payer: Self-pay | Admitting: Emergency Medicine

## 2020-04-15 ENCOUNTER — Emergency Department (HOSPITAL_COMMUNITY): Payer: 59

## 2020-04-15 ENCOUNTER — Emergency Department (HOSPITAL_COMMUNITY)
Admission: EM | Admit: 2020-04-15 | Discharge: 2020-04-16 | Disposition: A | Discharge status: Home / Self Care | Payer: 59 | Attending: Emergency Medicine | Admitting: Emergency Medicine

## 2020-04-15 DIAGNOSIS — W500XXA Accidental hit or strike by another person, initial encounter: Secondary | ICD-10-CM | POA: Diagnosis not present

## 2020-04-15 DIAGNOSIS — M25512 Pain in left shoulder: Secondary | ICD-10-CM

## 2020-04-15 DIAGNOSIS — Y9367 Activity, basketball: Secondary | ICD-10-CM | POA: Diagnosis not present

## 2020-04-15 DIAGNOSIS — Z7982 Long term (current) use of aspirin: Secondary | ICD-10-CM | POA: Diagnosis not present

## 2020-04-15 DIAGNOSIS — R202 Paresthesia of skin: Secondary | ICD-10-CM | POA: Insufficient documentation

## 2020-04-15 DIAGNOSIS — M25511 Pain in right shoulder: Secondary | ICD-10-CM | POA: Insufficient documentation

## 2020-04-15 NOTE — ED Triage Notes (Signed)
PT reports R shoulder injury while playing basketball 45 min ago.  He believes R shoulder is dislocated.  Denies pain.  Reports numbness to R thumb.  States he got hit between 2 other players.

## 2020-04-16 MED ORDER — METHOCARBAMOL 500 MG PO TABS: 500.0000 mg | ORAL_TABLET | Freq: Two times a day (BID) | ORAL | 0 refills | Status: DC

## 2020-04-16 MED ORDER — NAPROXEN 500 MG PO TABS: 500.0000 mg | ORAL_TABLET | Freq: Two times a day (BID) | ORAL | 0 refills | Status: DC

## 2020-04-16 NOTE — ED Notes (Signed)
DC instructions reviewed with patient and pt verbalized understanding. nad and vitals stable.

## 2020-04-16 NOTE — Progress Notes (Signed)
Orthopedic Tech Progress Note Patient Details:  RANNY WIEBELHAUS 12-Apr-1971 [id number]  Ortho Devices Type of Ortho Device: Sling immobilizer Ortho Device/Splint Location: RUE Ortho Device/Splint Interventions: Application, Adjustment   Post Interventions Patient Tolerated: Well Instructions Provided: Adjustment of device   Arianis Bowditch E Breckyn Troyer 04/16/2020, 12:57 AM

## 2020-04-16 NOTE — ED Provider Notes (Signed)
[facility] EMERGENCY DEPARTMENT Provider Note   CSN: [id number] Arrival date & time: 04/15/20  1751     History Chief Complaint  Patient presents with  . Shoulder Injury    Vincent Rodriguez is a 49 y.o. male.  The history is provided by the patient and medical records.  Shoulder Injury    49 y.o. M here with right shoulder pain.  States he was playing basketball and got hit in between 2 other players causing his shoulder to jerk.  States he felt like he dislocated his shoulder.  He is able to range but reports pain along posterior aspect of right shoulder/neck with lifting arm out to the side.  Reports "tingling" in right thumb.  He is right hand dominant.  No meds PTA.  Past Medical History:  Diagnosis Date  . Gilbert syndrome   . Hemorrhoids   . HLD (hyperlipidemia)   . Low testosterone   . OSA (obstructive sleep apnea) 09/03/2013  . Sleep apnea    no c-pap    Patient Active Problem List   Diagnosis Date Noted  . OSA (obstructive sleep apnea) 09/03/2013    Past Surgical History:  Procedure Laterality Date  . COLONOSCOPY         Family History  Problem Relation Age of Onset  . Colon cancer Father 49  . Liver cancer Father   . Stroke Mother   . Hypertension Mother   . Diabetes Brother        x 2  . Stroke Brother   . Liver disease Brother   . Throat cancer Brother   . Diabetes Brother   . Hypertension Sister   . Diabetes Brother     Social History   Tobacco Use  . Smoking status: Never Smoker  . Smokeless tobacco: Never Used  Substance Use Topics  . Alcohol use: No  . Drug use: No    Home Medications Prior to Admission medications   Medication Sig Start Date End Date Taking? Authorizing Provider  aspirin-acetaminophen-caffeine (EXCEDRIN MIGRAINE) [phone] MG per tablet Take 2 tablets by mouth daily as needed for headache.    [provider]  ibuprofen (ADVIL,MOTRIN) 600 MG tablet Take 1 tablet (600 mg total) by  mouth every 6 (six) hours as needed. Patient not taking: Reported on 10/03/2017 10/04/16   Lorayne Bender, PA-C  ibuprofen (ADVIL,MOTRIN) 800 MG tablet Take 1 tablet (800 mg total) 3 (three) times daily by mouth. Patient not taking: Reported on 10/03/2017 03/20/17   Glyn Ade, PA-C  methocarbamol (ROBAXIN) 500 MG tablet Take 1 tablet (500 mg total) by mouth 2 (two) times daily. 04/16/20   Larene Pickett, PA-C  Multiple Vitamin (MULTIVITAMIN) tablet Take 1 tablet by mouth daily.    [provider]  naproxen (NAPROSYN) 500 MG tablet Take 1 tablet (500 mg total) by mouth 2 (two) times daily with a meal. 04/16/20   Larene Pickett, PA-C  testosterone (TESTIM) 50 MG/5GM (1%) GEL Place onto the skin.    [provider]    Allergies    Shrimp [shellfish allergy]  Review of Systems   Review of Systems  Musculoskeletal: Positive for arthralgias.  All other systems reviewed and are negative.   Physical Exam Updated Vital Signs BP (!) 168/99 (BP Location: Left Arm)   Pulse 69   Temp 98.2 F (36.8 C) (Oral)   Resp 18   Ht 5\' 6"  (1.676 m)   Wt 77.6 kg  SpO2 100%   BMI 27.60 kg/m   Physical Exam Vitals and nursing note reviewed.  Constitutional:      Appearance: He is well-developed.  HENT:     Head: Normocephalic and atraumatic.  Eyes:     Conjunctiva/sclera: Conjunctivae normal.     Pupils: Pupils are equal, round, and reactive to light.  Cardiovascular:     Rate and Rhythm: Normal rate and regular rhythm.     Heart sounds: Normal heart sounds.  Pulmonary:     Effort: Pulmonary effort is normal.     Breath sounds: Normal breath sounds.  Abdominal:     General: Bowel sounds are normal.     Palpations: Abdomen is soft.  Musculoskeletal:        General: Normal range of motion.     Cervical back: Normal range of motion.     Comments: Right shoulder normal in appearance without signs of dislocation or separation on exam, fully able to flex right arm across  chest and touch left shoulder; pain elicited along posterior shoulder when arm abducted but able to do so, no strength/sensory weakness elicited, able to text with phone using right hand  Skin:    General: Skin is warm and dry.  Neurological:     Mental Status: He is alert and oriented to person, place, and time.     ED Results / Procedures / Treatments   Labs (all labs ordered are listed, but only abnormal results are displayed) Labs Reviewed - No data to display  EKG None  Radiology DG Shoulder Right  Result Date: 04/15/2020 CLINICAL DATA:  Right shoulder injury playing basketball EXAM: RIGHT SHOULDER - 2+ VIEW COMPARISON:  None. FINDINGS: Frontal and transscapular views of the right shoulder demonstrate no fracture, subluxation, or dislocation. Mild glenohumeral osteoarthritis. The right chest is clear. IMPRESSION: 1. Mild osteoarthritis.  No acute bony abnormality. Electronically Signed   By: Randa Ngo M.D.   On: 04/15/2020 18:43    Procedures Procedures (including critical care time)  Medications Ordered in ED Medications - No data to display  ED Course  I have reviewed the triage vital signs and the nursing notes.  Pertinent labs & imaging results that were available during my care of the patient were reviewed by me and considered in my medical decision making (see chart for details).    MDM Rules/Calculators/A&P  49 y.o. M here with right shoulder pain while playing basketball.  Sounds like he was smashed in between 2 other players and jerked his shoulder.  No signs of dislocation or separation on exam today.  Arm is neurovascularly intact without appreciable strength or sensory weakness.  Does have noted pain with abduction of the right shoulder, however reports this is mostly along the posterior aspect of the shoulder and does have appreciable spasm.  X-rays negative.  Patient placed in shoulder sling.  Plan to discharge home with symptomatic care and orthopedic  follow-up if not improving in the next few days.  May return here for any new or acute changes.  Final Clinical Impression(s) / ED Diagnoses Final diagnoses:  Acute pain of left shoulder    Rx / DC Orders ED Discharge Orders         Ordered    methocarbamol (ROBAXIN) 500 MG tablet  2 times daily        04/16/20 0038    naproxen (NAPROSYN) 500 MG tablet  2 times daily with meals        04/16/20 0038  Larene Pickett, PA-C 04/16/20 0048    Fatima Blank, MD 04/16/20 [phone]

## 2020-04-16 NOTE — Discharge Instructions (Addendum)
Take the prescribed medication as directed.  Wear sling for now. Follow-up with orthopedics if symptoms not improving in the next few days. Return to the ED for new or worsening symptoms.

## 2021-01-22 DIAGNOSIS — I10 Essential (primary) hypertension: Secondary | ICD-10-CM | POA: Insufficient documentation

## 2021-05-03 IMAGING — DX DG SHOULDER 2+V*R*
2 series · 2 of 2 positions shown · non-contrast
Comparison: None.

CLINICAL DATA: Right shoulder injury playing basketball

EXAM:
RIGHT SHOULDER - 2+ VIEW

[shoulder grashey]
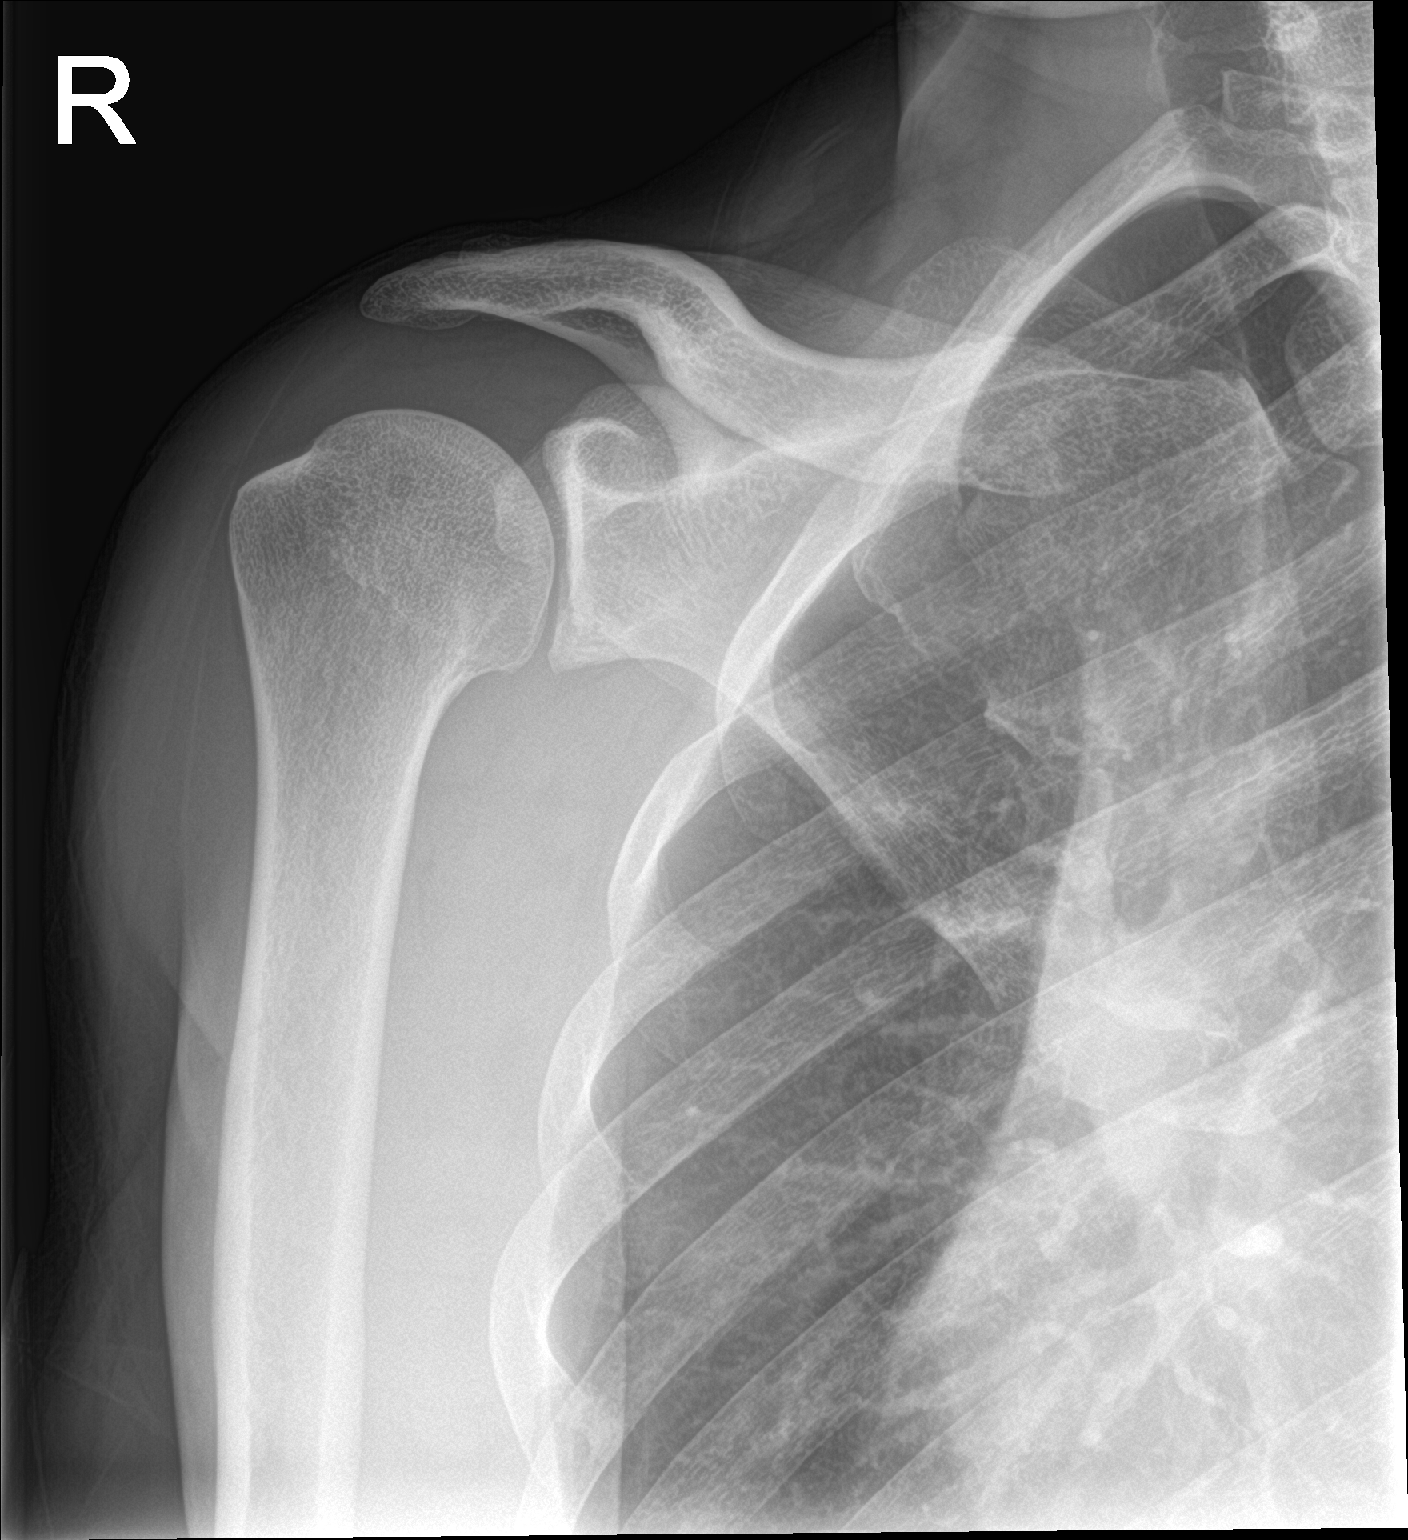

[shoulder y view]
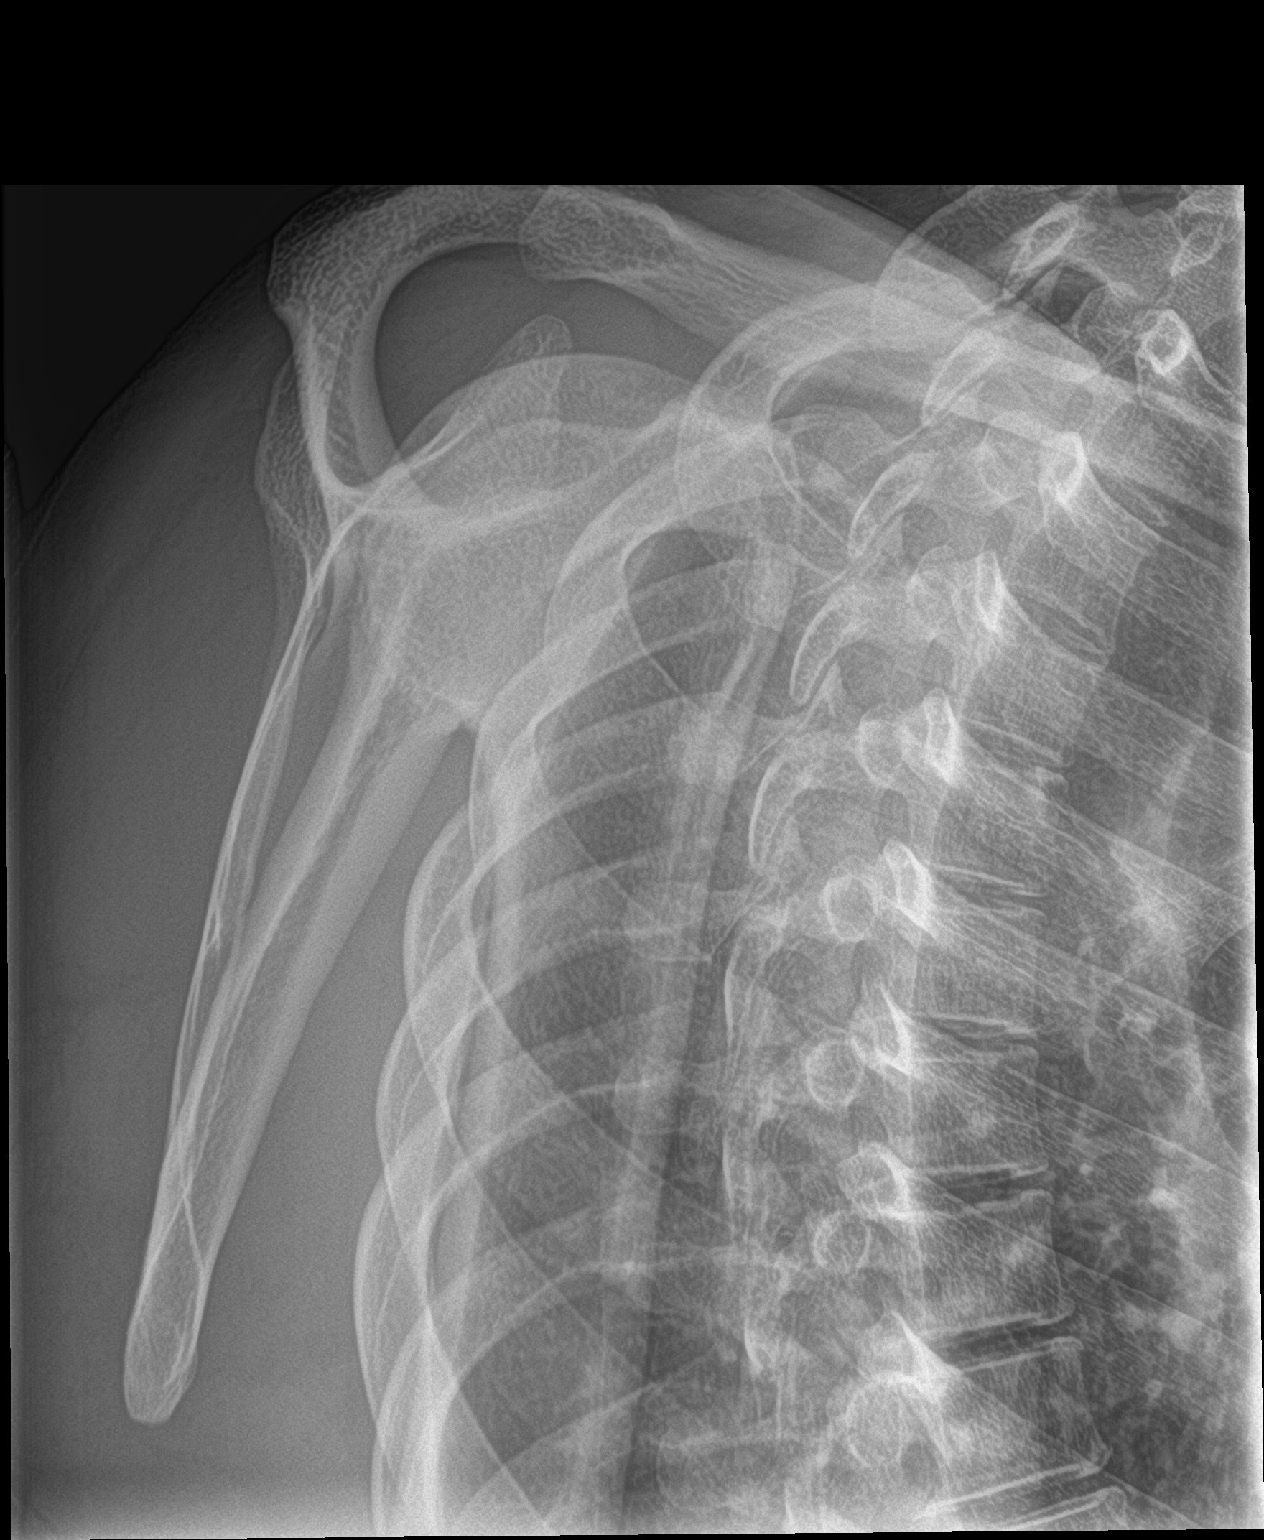

[2 of 2 positions shown; findings below may reference images not displayed]

FINDINGS: Frontal and transscapular views of the right shoulder demonstrate no
fracture, subluxation, or dislocation. Mild glenohumeral
osteoarthritis. The right chest is clear.
IMPRESSION: 1. Mild osteoarthritis.  No acute bony abnormality.

## 2021-12-10 ENCOUNTER — Other Ambulatory Visit: Payer: Self-pay | Admitting: Urology

## 2021-12-10 ENCOUNTER — Other Ambulatory Visit (HOSPITAL_COMMUNITY): Payer: Self-pay | Admitting: Urology

## 2021-12-10 DIAGNOSIS — R972 Elevated prostate specific antigen [PSA]: Secondary | ICD-10-CM

## 2021-12-20 ENCOUNTER — Other Ambulatory Visit: Payer: Self-pay

## 2021-12-20 ENCOUNTER — Emergency Department (HOSPITAL_COMMUNITY)
Admission: EM | Admit: 2021-12-20 | Discharge: 2021-12-20 | Disposition: A | Discharge status: Home / Self Care | Payer: 59 | Attending: Emergency Medicine | Admitting: Emergency Medicine

## 2021-12-20 ENCOUNTER — Encounter (HOSPITAL_COMMUNITY): Payer: Self-pay | Admitting: Emergency Medicine

## 2021-12-20 DIAGNOSIS — T63441A Toxic effect of venom of bees, accidental (unintentional), initial encounter: Secondary | ICD-10-CM | POA: Diagnosis not present

## 2021-12-20 HISTORY — DX: Essential (primary) hypertension: I10

## 2021-12-20 MED ORDER — METHYLPREDNISOLONE SODIUM SUCC 125 MG IJ SOLR
125.0000 mg | Freq: Once | INTRAMUSCULAR | Status: AC
  Administered 2021-12-20: 125 mg via INTRAMUSCULAR
  Filled 2021-12-20: qty 2

## 2021-12-20 NOTE — Discharge Instructions (Signed)
You were seen in the emergency department for bee sting.  It's good you took the benadryl and came to be evaluated quickly. We have given you a steroid shot which will continue to help the swelling. You can take ibuprofen or tylenol for pain. Icing the areas may help with some discomfort too.  Continue to monitor how you're doing and return to the ER for new or worsening symptoms such as difficulty swallowing or breathing.

## 2021-12-20 NOTE — ED Provider Notes (Signed)
[facility] EMERGENCY DEPARTMENT Provider Note   CSN: [id number] Arrival date & time: 12/20/21  2020     History  Chief Complaint  Patient presents with   Bee Sting    Vincent Rodriguez is a 51 y.o. male who presents the emergency department after multiple bee stings.  Patient states that he was mowing grass and was stung about 2 hours prior to ER arrival.  He was stung on both of his hands, and back of his head.  He took 4 Benadryl.  States in the past that he has had bad reactions to bee stings and developed cellulitis afterwards.  Not complaining of any sore throat, difficulty swallowing or breathing.  HPI     Home Medications Prior to Admission medications   Medication Sig Start Date End Date Taking? Authorizing Provider  aspirin-acetaminophen-caffeine (EXCEDRIN MIGRAINE) [phone] MG per tablet Take 2 tablets by mouth daily as needed for headache.    [provider]  ibuprofen (ADVIL,MOTRIN) 600 MG tablet Take 1 tablet (600 mg total) by mouth every 6 (six) hours as needed. Patient not taking: Reported on 10/03/2017 10/04/16   Lorayne Bender, PA-C  ibuprofen (ADVIL,MOTRIN) 800 MG tablet Take 1 tablet (800 mg total) 3 (three) times daily by mouth. Patient not taking: Reported on 10/03/2017 03/20/17   Glyn Ade, PA-C  methocarbamol (ROBAXIN) 500 MG tablet Take 1 tablet (500 mg total) by mouth 2 (two) times daily. 04/16/20   Larene Pickett, PA-C  Multiple Vitamin (MULTIVITAMIN) tablet Take 1 tablet by mouth daily.    [provider]  naproxen (NAPROSYN) 500 MG tablet Take 1 tablet (500 mg total) by mouth 2 (two) times daily with a meal. 04/16/20   Larene Pickett, PA-C  testosterone (TESTIM) 50 MG/5GM (1%) GEL Place onto the skin.    [provider]      Allergies    Shrimp [shellfish allergy]    Review of Systems   Review of Systems  Constitutional:  Negative for chills.  HENT:  Negative for sore throat and trouble swallowing.    Respiratory:  Negative for shortness of breath.   Skin:  Positive for wound.  All other systems reviewed and are negative.   Physical Exam Updated Vital Signs BP (!) 143/85 (BP Location: Right Arm)   Pulse 79   Temp 98.3 F (36.8 C) (Oral)   Resp 18   SpO2 100%  Physical Exam Vitals and nursing note reviewed.  Constitutional:      Appearance: Normal appearance.  HENT:     Head: Normocephalic and atraumatic.     Mouth/Throat:     Mouth: No angioedema.  Eyes:     Conjunctiva/sclera: Conjunctivae normal.  Pulmonary:     Effort: Pulmonary effort is normal. No accessory muscle usage, prolonged expiration or respiratory distress.     Breath sounds: Normal breath sounds and air entry.  Skin:    General: Skin is warm and dry.     Comments: Multiple small lesions noted to the bilateral dorsum of hands, left forearm, left upper arm, and left posterior scalp  Neurological:     Mental Status: He is alert.  Psychiatric:        Mood and Affect: Mood normal.        Behavior: Behavior normal.     ED Results / Procedures / Treatments   Labs (all labs ordered are listed, but only abnormal results are displayed) Labs Reviewed - No data to display  EKG  None  Radiology No results found.  Procedures Procedures    Medications Ordered in ED Medications  methylPREDNISolone sodium succinate (SOLU-MEDROL) 125 mg/2 mL injection 125 mg (125 mg Intramuscular Given 12/20/21 2147)    ED Course/ Medical Decision Making/ A&P                           Medical Decision Making Risk Prescription drug management.   Patient is a 51 year old male with history of HLD, OSA, and hypertension who presents the emergency department after multiple bee stings.  Believes he is allergic, but has never been formally tested.  On exam patient has normal vital signs.  Multiple lesions noted to the bilateral hands, left lower and upper arm, and left posterior scalp with minimal swelling.  No increased  work of breathing, tolerating own secretions.  No evidence of angioedema.  As patient has already taken benadryl, will give dose of steroids for minor allergic reaction and discharge to home with close return precautions. Low concern for anaphylaxis.  Patient stable for discharge home, all questions answered.        Final Clinical Impression(s) / ED Diagnoses Final diagnoses:  Allergic reaction to bee sting    Rx / DC Orders ED Discharge Orders     None      Portions of this report may have been transcribed using voice recognition software. Every effort was made to ensure accuracy; however, inadvertent computerized transcription errors may be present.    Estill Cotta 12/21/21 1204    Dorie Rank, MD 12/21/21 1501

## 2021-12-20 NOTE — ED Triage Notes (Signed)
Patient stung multiple times by bees this afternoon while mowing grass at hands /arms and scalp , he took 4 tabs of Benadryl prior to arrival . Airway intact / no oral swelling . Respirations unlabored .

## 2021-12-21 ENCOUNTER — Ambulatory Visit
Admission: RE | Admit: 2021-12-21 | Discharge: 2021-12-21 | Disposition: A | Discharge status: Home / Self Care | Payer: 59 | Source: Ambulatory Visit | Attending: Urology | Admitting: Urology

## 2021-12-21 DIAGNOSIS — R972 Elevated prostate specific antigen [PSA]: Secondary | ICD-10-CM | POA: Insufficient documentation

## 2021-12-21 MED ORDER — GADOBUTROL 1 MMOL/ML IV SOLN
7.5000 mL | Freq: Once | INTRAVENOUS | Status: AC | PRN
  Administered 2021-12-21: 7.5 mL via INTRAVENOUS

## 2022-09-26 ENCOUNTER — Encounter: Payer: Self-pay | Admitting: Gastroenterology

## 2022-10-16 ENCOUNTER — Encounter: Payer: Self-pay | Admitting: Gastroenterology

## 2022-10-22 DIAGNOSIS — R300 Dysuria: Secondary | ICD-10-CM | POA: Insufficient documentation

## 2022-10-22 DIAGNOSIS — R8290 Unspecified abnormal findings in urine: Secondary | ICD-10-CM | POA: Insufficient documentation

## 2022-10-25 ENCOUNTER — Ambulatory Visit: Payer: 59

## 2022-10-25 ENCOUNTER — Telehealth: Payer: Self-pay | Admitting: *Deleted

## 2022-10-25 NOTE — Telephone Encounter (Signed)
Patient has been rescheduled for 7/9

## 2022-10-25 NOTE — Progress Notes (Unsigned)
Pt's name and DOB verified at the beginning of the pre-visit.  Pt denies any difficulty with ambulating,sitting, laying down or rolling side to side Gave both [facility] main # and MD on call # prior to instructions.  No egg or soy allergy known to patient  No issues known to pt with past sedation with any surgeries or procedures Pt denies having issues being intubated Patient denies ever being intubated Pt has no issues moving head neck or swallowing No FH of Malignant Hyperthermia Pt is not on diet pills Pt is not on home 02  Pt is not on blood thinners  Pt denies issues with constipation  Pt has frequent issues with constipation RN instructed pt to use Miralax per bottles instructions a week before prep days. Pt states they will Pt is not on dialysis Pt denise any abnormal heart rhythms  Pt denies any upcoming cardiac testing Pt encouraged to use to use Singlecare or Goodrx to reduce cost  Patient's chart reviewed by John Nulty CNRA prior to pre-visit and patient appropriate for the [facility].  Pre-visit completed and red dot placed by patient's name on their procedure day (on provider's schedule).  . Visit by phone Pt states weight is  Instructed pt why it is important to and  to call if they have any changes in health or new medications. Directed them to the # given and on instructions.   Pt states they will.  Instructions reviewed with pt and pt states understanding. Instructed to review again prior to procedure. Pt states they will.  Instructions sent by mail with coupon and by my chart   

## 2022-10-25 NOTE — Telephone Encounter (Signed)
Attempt to reach pt for pre-visit. LM with call back #. Will attempt in 5 min to only # listed. Second attempt to reach pt for pre-vist unsuccessful. LM with facility # for pt to call back. Instructed pt to call # given by end of the day and reschedule the pre-visit or the scheduled procedure will be canceled.  RN will verify if pt has rescheduled pre-visit or not at end of day and if no per protocol Procedure will be canceled,

## 2022-10-30 ENCOUNTER — Ambulatory Visit: Payer: Self-pay | Admitting: Urology

## 2022-11-19 ENCOUNTER — Telehealth: Payer: Self-pay

## 2022-11-19 ENCOUNTER — Encounter: Payer: Self-pay | Admitting: Gastroenterology

## 2022-11-19 ENCOUNTER — Ambulatory Visit (AMBULATORY_SURGERY_CENTER): Payer: 59

## 2022-11-19 VITALS — Ht 65.5 in | Wt 172.0 lb

## 2022-11-19 DIAGNOSIS — Z86010 Personal history of colonic polyps: Secondary | ICD-10-CM

## 2022-11-19 DIAGNOSIS — Z800 Family history of malignant neoplasm of digestive organs: Secondary | ICD-10-CM

## 2022-11-19 MED ORDER — NA SULFATE-K SULFATE-MG SULF 17.5-3.13-1.6 GM/177ML PO SOLN: 1.0000 | Freq: Once | ORAL | 0 refills | Status: AC

## 2022-11-19 NOTE — Progress Notes (Signed)
No egg or soy allergy known to patient  No issues known to pt with past sedation with any surgeries or procedures Patient denies ever being told they had issues or difficulty with intubation  No FH of Malignant Hyperthermia Pt is not on diet pills Pt is not on  home 02  Pt is not on blood thinners  Pt denies issues with constipation  No A fib or A flutter Have any cardiac testing pending--no Patient's chart reviewed by Cathlyn Parsons CNRA prior to previsit and patient appropriate for the [facility].  Previsit completed and red dot placed by patient's name on their procedure day (on provider's schedule).   Ambulates independently Pt instructed to use Singlecare.com or GoodRx for a price reduction on prep   Patient changed procedure date during pre-visit. RN changed pre-visit paperwork to reflect appropriate changes.

## 2022-11-21 NOTE — Telephone Encounter (Signed)
Noted  

## 2022-11-27 ENCOUNTER — Encounter: Payer: 59 | Admitting: Gastroenterology

## 2022-11-29 ENCOUNTER — Ambulatory Visit (AMBULATORY_SURGERY_CENTER): Payer: 59 | Admitting: Gastroenterology

## 2022-11-29 ENCOUNTER — Encounter: Payer: Self-pay | Admitting: Gastroenterology

## 2022-11-29 VITALS — BP 119/77 | HR 68 | Resp 22

## 2022-11-29 DIAGNOSIS — Z1211 Encounter for screening for malignant neoplasm of colon: Secondary | ICD-10-CM | POA: Diagnosis present

## 2022-11-29 DIAGNOSIS — Z800 Family history of malignant neoplasm of digestive organs: Secondary | ICD-10-CM | POA: Diagnosis not present

## 2022-11-29 NOTE — Progress Notes (Signed)
[facility] History and Physical   Primary Care Physician:  Tracey Harries, MD   Reason for Procedure:   Colon cancer screening  Plan:    Screening colonoscopy     HPI: Vincent Rodriguez is a 52 y.o. male undergoing high risk screening colonoscopy.  His father was diagnosed with colon cancer at age 36.  He has no chronic GI symptoms. His last colonoscopy was in 2019 and was normal.   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 Authorizing Provider  amLODipine (NORVASC) 10 MG tablet Take 10 mg by mouth daily.    [provider]  aspirin-acetaminophen-caffeine (EXCEDRIN MIGRAINE) [phone] MG per tablet Take 2 tablets by mouth daily as needed for headache.    [provider]  ibuprofen (ADVIL,MOTRIN) 600 MG tablet Take 1 tablet (600 mg total) by mouth every 6 (six) hours as needed. Patient not taking: Reported on 10/03/2017 10/04/16   Anselm Pancoast, PA-C  ibuprofen (ADVIL,MOTRIN) 800 MG tablet Take 1 tablet (800 mg total) 3 (three) times daily by mouth. Patient not taking: Reported on 10/03/2017 03/20/17   Kellie Shropshire, PA-C  methocarbamol (ROBAXIN) 500 MG tablet Take 1 tablet (500 mg total) by mouth 2 (two) times daily. Patient not taking: Reported on 11/19/2022 04/16/20   Garlon Hatchet, PA-C  Multiple Vitamin (MULTIVITAMIN) tablet Take 1 tablet by mouth daily.    [provider]  naproxen (NAPROSYN) 500 MG tablet Take 1 tablet (500 mg total) by mouth 2 (two) times daily with a meal. Patient not taking: Reported on 11/19/2022 04/16/20   Garlon Hatchet, PA-C  pravastatin (PRAVACHOL) 20 MG tablet Take 20 mg by mouth daily.     [provider]  tamsulosin (FLOMAX) 0.4 MG CAPS capsule Take 0.4 mg by mouth daily.    [provider]  testosterone (TESTIM) 50 MG/5GM (1%) GEL Place onto the skin. Patient not taking: Reported on 11/19/2022    [provider]    Current Outpatient Medications  Medication Sig Dispense Refill   amLODipine (NORVASC) 10 MG tablet Take 10 mg by mouth daily.     aspirin-acetaminophen-caffeine (EXCEDRIN MIGRAINE) [phone] MG per tablet Take 2 tablets by mouth daily as needed for headache.     ibuprofen (ADVIL,MOTRIN) 600 MG tablet Take 1 tablet (600 mg total) by mouth every 6 (six) hours as needed. (Patient not taking: Reported on 10/03/2017) 30 tablet 0   ibuprofen (ADVIL,MOTRIN) 800 MG tablet Take 1 tablet (800 mg total) 3 (three) times daily by mouth. (Patient not taking: Reported on 10/03/2017) 21 tablet 0   methocarbamol (ROBAXIN) 500 MG tablet Take 1 tablet (500 mg total) by mouth 2 (two) times daily. (Patient not taking: Reported on 11/19/2022) 20 tablet 0   Multiple Vitamin (MULTIVITAMIN) tablet Take 1 tablet by mouth daily.     naproxen (NAPROSYN) 500 MG tablet Take 1 tablet (500 mg total) by mouth 2 (two) times daily with a meal. (Patient not taking: Reported on 11/19/2022) 30 tablet 0   pravastatin (PRAVACHOL) 20 MG tablet Take 20 mg by mouth daily.  tamsulosin (FLOMAX) 0.4 MG CAPS capsule Take 0.4 mg by mouth daily.     testosterone (TESTIM) 50 MG/5GM (1%) GEL Place onto the skin. (Patient not taking: Reported on 11/19/2022)     Current Facility-Administered Medications  Medication Dose Route Frequency Provider Last Rate Last Admin   0.9 %  sodium chloride infusion  500 mL Intravenous Once Meryl Dare, MD        Allergies as of 11/29/2022 - Review Complete 11/29/2022  Allergen Reaction Noted   Shrimp [shellfish allergy] Itching and Swelling 12/04/2014    Family History  Problem Relation Age of Onset   Stroke Mother    Hypertension Mother    Colon  cancer Father 90   Liver cancer Father    Hypertension Sister    Diabetes Brother        x 2   Stroke Brother    Esophageal cancer Brother    Liver disease Brother    Throat cancer Brother    Diabetes Brother    Diabetes Brother    Colon polyps Neg Hx    Rectal cancer Neg Hx    Stomach cancer Neg Hx     Social History   Socioeconomic History   Marital status: Married    Spouse name: Not on file   Number of children: 0   Years of education: Not on file   Highest education level: Not on file  Occupational History   Occupation: street Buyer, retail: UNEMPLOYED  Tobacco Use   Smoking status: Never   Smokeless tobacco: Never  Vaping Use   Vaping status: Never Used  Substance and Sexual Activity   Alcohol use: No   Drug use: No   Sexual activity: Not on file  Other Topics Concern   Not on file  Social History Narrative   Not on file   Social Determinants of Health   Financial Resource Strain: Low Risk  (11/26/2021)   Received from [facility], [facility]   Overall Financial Resource Strain (CARDIA)    Difficulty of Paying Living Expenses: Not hard at all  Food Insecurity: No Food Insecurity (07/16/2021)   [facility], [facility]   Hunger Vital Sign    Worried About Running Out of Food in the Last Year: Never true    Ran Out of Food in the Last Year: Never true  Transportation Needs: No Transportation Needs (11/26/2021)   Received from [facility], [facility]   PRAPARE - Transportation    Lack of Transportation (Medical): No    Lack of Transportation (Non-Medical): No  Physical Activity: Sufficiently Active (11/26/2021)   Received from [facility], [facility]   Exercise Vital Sign    Days of Exercise per Week: 5 days    Minutes of Exercise per Session: 60 min  Stress: No Stress Concern Present (11/26/2021)   Received from [facility], [facility] - Occupational Stress  Questionnaire    Feeling of Stress : Not at all  Social Connections: Unknown (09/24/2021)   Received from [facility], [facility]   Social Network    Social Network: Not on file  Intimate Partner Violence: Not At Risk (11/26/2021)   Received from [facility], [facility]   Humiliation, Afraid, Rape, and Kick questionnaire    Fear of Current or Ex-Partner: No    Emotionally Abused: No    Physically Abused: No    Sexually Abused: No  Review of Systems:  All other review of systems negative except as mentioned in the HPI.  Physical Exam: Vital signs There were no vitals taken for this visit.  General:   Alert,  Well-developed, well-nourished, pleasant and cooperative in NAD Airway:  Mallampati 1 Lungs:  Clear throughout to auscultation.   Heart:  Regular rate and rhythm; no murmurs, clicks, rubs,  or gallops. Abdomen:  Soft, nontender and nondistended. Normal bowel sounds.   Neuro/Psych:  Normal mood and affect. A and O x 3   Arseniy Toomey E. Tomasa Rand, MD [facility]

## 2022-11-29 NOTE — Progress Notes (Signed)
Sedate, gd SR, tolerated procedure well, VSS, report to RN 

## 2022-12-02 ENCOUNTER — Telehealth: Payer: Self-pay

## 2022-12-02 NOTE — Telephone Encounter (Signed)
  Follow up Call-      No data to display           Patient questions:  Do you have a fever, pain , or abdominal swelling? No. Pain Score  0 *  Have you tolerated food without any problems? Yes.    Have you been able to return to your normal activities? Yes.    Do you have any questions about your discharge instructions: Diet   No. Medications  No. Follow up visit  No.  Do you have questions or concerns about your Care? No.  Actions: * If pain score is 4 or above: No action needed, pain <4.   

## 2022-12-02 NOTE — Op Note (Signed)
[facility] Patient Name: Vincent Rodriguez Procedure Date: 12/01/2022 8:26 PM MRN: [id number] Endoscopist: Lorin Picket E. Tomasa Rand , MD, [id number] Age: 52 Referring MD:  Date of Birth: December 30, 1970 Gender: Male Account #: [id number] Procedure:                Colonoscopy Indications:              Screening in patient at increased risk: Colorectal                            cancer in father before age 70 Medicines:                Monitored Anesthesia Care Procedure:                Pre-Anesthesia Assessment:                           - Prior to the procedure, a History and Physical                            was performed, and patient medications and                            allergies were reviewed. The patient's tolerance of                            previous anesthesia was also reviewed. The risks                            and benefits of the procedure and the sedation                            options and risks were discussed with the patient.                            All questions were answered, and informed consent                            was obtained. Prior Anticoagulants: The patient has                            taken no anticoagulant or antiplatelet agents. ASA                            Grade Assessment: II - A patient with mild systemic                            disease. After reviewing the risks and benefits,                            the patient was deemed in satisfactory condition to                            undergo the procedure.  After obtaining informed consent, the colonoscope                            was passed under direct vision. Throughout the                            procedure, the patient's blood pressure, pulse, and                            oxygen saturations were monitored continuously. The                            CF [id number] #[id number] was introduced through the anus                            and advanced to  the the terminal ileum, with                            identification of the appendiceal orifice and IC                            valve. The colonoscopy was performed without                            difficulty. The patient tolerated the procedure                            well. The quality of the bowel preparation was                            excellent. No anatomical landmarks were                            photographed. The bowel preparation used was SUPREP                            via split dose instruction. Findings:                 The perianal and digital rectal examinations were                            normal. Pertinent negatives include normal                            sphincter tone and no palpable rectal lesions.                           The colon (entire examined portion) appeared normal.                           The terminal ileum appeared normal.                           The retroflexed view of the distal rectum and anal  verge was normal and showed no anal or rectal                            abnormalities. Complications:            No immediate complications. Estimated Blood Loss:     Estimated blood loss: none. Impression:               - The entire examined colon is normal.                           - The examined portion of the ileum was normal.                           - The distal rectum and anal verge are normal on                            retroflexion view.                           - No specimens collected. Recommendation:           - Patient has a contact number available for                            emergencies. The signs and symptoms of potential                            delayed complications were discussed with the                            patient. Return to normal activities tomorrow.                            Written discharge instructions were provided to the                            patient.                            - Resume previous diet.                           - Continue present medications.                           - Repeat colonoscopy in 5 years for screening                            purposes due to family history of colon cancer.                           ***********************DUE TO [employer]                            OUTAGE AT THE TIME OF THIS PROCEDURE, NO  PHOTOGRAPHS WERE ABLE TO BE                            RECORDED***************** Maleigh Bagot E. Tomasa Rand, MD 12/01/2022 8:29:32 PM This report has been signed electronically.

## 2023-02-13 ENCOUNTER — Ambulatory Visit: Payer: 59 | Admitting: Urology

## 2023-02-17 ENCOUNTER — Encounter: Payer: Self-pay | Admitting: Urology

## 2023-02-17 ENCOUNTER — Ambulatory Visit: Payer: 59 | Admitting: Urology

## 2023-02-17 VITALS — BP 141/86 | HR 60 | Ht 65.0 in | Wt 170.0 lb

## 2023-02-17 DIAGNOSIS — Z87898 Personal history of other specified conditions: Secondary | ICD-10-CM

## 2023-02-17 DIAGNOSIS — E291 Testicular hypofunction: Secondary | ICD-10-CM

## 2023-02-17 DIAGNOSIS — N401 Enlarged prostate with lower urinary tract symptoms: Secondary | ICD-10-CM

## 2023-02-17 DIAGNOSIS — R300 Dysuria: Secondary | ICD-10-CM

## 2023-02-17 MED ORDER — TAMSULOSIN HCL 0.4 MG PO CAPS: 0.4000 mg | ORAL_CAPSULE | Freq: Every day | ORAL | 3 refills | Status: AC

## 2023-02-17 NOTE — Progress Notes (Signed)
I, [name], acting as a scribe for Vincent Altes, MD., have documented all relevant documentation on the behalf of Vincent Altes, MD, as directed by Vincent Altes, MD while in the presence of Vincent Altes, MD.  02/17/2023 12:22 PM   Vincent Rodriguez [birth year] [id number]  Referring provider: Tracey Harries, MD [address] [place],  [place] [address]  Chief Complaint  Patient presents with   Hypogonadism    HPI: Vincent Rodriguez is a 52 y.o. male presents to reestablish urologic care.   Was previously followed by Dr. Evelene Rodriguez for BPH and hypogonadism, He has been on Clomid and AndroGel in the past, but nothing in the last 12 months.  He was also taking tamsulosin and has not had any medication for the last 6 months. A prostate MRI was performed July 2023 for a PSA bump to 4.6 and the MRI did not show any lesions suspicious for high-grade prostate cancer. He had an exosome study done, which was above the cutoff at 39 A repeat PSA september 2023 was 3.8 His PCP repeated his PSA July 2024 which was 3.1  He has recently had nocturia x4-5 but does drink water up until 8-8:30 at night. Mild tiredness/fatigue.    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 If you have any questions, ask your nurse or doctor.          STOP taking these medications    ibuprofen 600 MG tablet Commonly known as: ADVIL Stopped by: Vincent Rodriguez   ibuprofen 800 MG tablet Commonly known as:  ADVIL Stopped by: Vincent Rodriguez   methocarbamol 500 MG tablet Commonly known as: ROBAXIN Stopped by: Vincent Rodriguez   naproxen 500 MG tablet Commonly known as: Naprosyn Stopped by: Vincent Rodriguez       TAKE these medications    amLODipine 10 MG tablet Commonly known as: NORVASC Take 10 mg by mouth daily.   aspirin-acetaminophen-caffeine 250-250-65 MG tablet Commonly known as: EXCEDRIN MIGRAINE Take 2 tablets by mouth daily as needed for headache.   multivitamin tablet Take 1 tablet by mouth daily.   pravastatin 20 MG tablet Commonly known as: PRAVACHOL Take 20 mg by mouth daily.   tamsulosin 0.4 MG Caps capsule Commonly known as: FLOMAX Take 1 capsule (0.4 mg total) by mouth daily.   Testim 50 MG/5GM (1%) Gel Generic drug: testosterone Place onto the skin.        Allergies:  Allergies  Allergen Reactions   Shrimp [Shellfish Allergy] Itching and Swelling    Family History: Family History  Problem Relation Age of Onset   Stroke Mother    Hypertension Mother    Colon cancer Father 9   Liver cancer Father    Hypertension Sister    Diabetes Brother        x 2  Stroke Brother    Esophageal cancer Brother    Liver disease Brother    Throat cancer Brother    Diabetes Brother    Diabetes Brother    Colon polyps Neg Hx    Rectal cancer Neg Hx    Stomach cancer Neg Hx     Social History:  reports that he has never smoked. He has never used smokeless tobacco. He reports that he does not drink alcohol and does not use drugs.   Physical Exam: BP (!) 141/86   Pulse 60   Ht 5\' 5"  (1.651 m)   Wt 170 lb (77.1 kg)   BMI 28.29 kg/m   Constitutional:  Alert and oriented, No acute distress. HEENT: [place] AT Respiratory: Normal respiratory effort, no increased work of breathing. GU: Prostate 40 grams, smooth without nodules Psychiatric: Normal mood and affect.   Assessment & Plan:    1. History of elevated PSA PSA has decreased and however his most  recent value has returned to normal and just slightly above baseline Follow-up lab visit for PSA January 2025  2. BPH with LUTS Restart tamsulosin 0.4 mg daily.  Recommend decreasing or limiting water intake after dinner time.   3. Hypogonadism Check testosterone and LH. We'll call with results.   I have reviewed the above documentation for accuracy and completeness, and I agree with the above.   Vincent Altes, MD  [facility] [address] [place], [place] [address] [phone]

## 2023-02-18 LAB — LUTEINIZING HORMONE: LH: 5.4 m[IU]/mL (ref 1.7–8.6)

## 2023-02-18 LAB — TESTOSTERONE: Testosterone: 346 ng/dL (ref 264–916)

## 2023-04-01 ENCOUNTER — Other Ambulatory Visit: Payer: Self-pay

## 2023-04-01 ENCOUNTER — Encounter (HOSPITAL_BASED_OUTPATIENT_CLINIC_OR_DEPARTMENT_OTHER): Payer: Self-pay

## 2023-04-01 ENCOUNTER — Emergency Department (HOSPITAL_BASED_OUTPATIENT_CLINIC_OR_DEPARTMENT_OTHER)
Admission: EM | Admit: 2023-04-01 | Discharge: 2023-04-01 | Disposition: A | Discharge status: Home / Self Care | Payer: 59 | Attending: Emergency Medicine | Admitting: Emergency Medicine

## 2023-04-01 DIAGNOSIS — K1120 Sialoadenitis, unspecified: Secondary | ICD-10-CM | POA: Diagnosis not present

## 2023-04-01 DIAGNOSIS — R6884 Jaw pain: Secondary | ICD-10-CM | POA: Diagnosis present

## 2023-04-01 DIAGNOSIS — Z7982 Long term (current) use of aspirin: Secondary | ICD-10-CM | POA: Insufficient documentation

## 2023-04-01 MED ORDER — AMOXICILLIN-POT CLAVULANATE 875-125 MG PO TABS: 1.0000 | ORAL_TABLET | Freq: Two times a day (BID) | ORAL | 0 refills | Status: AC

## 2023-04-01 MED ORDER — AMOXICILLIN-POT CLAVULANATE 875-125 MG PO TABS: 1.0000 | ORAL_TABLET | Freq: Once | ORAL | Status: AC

## 2023-04-01 MED ORDER — AMOXICILLIN-POT CLAVULANATE 875-125 MG PO TABS
ORAL_TABLET | ORAL | Status: AC
  Administered 2023-04-01: 1 via ORAL
  Filled 2023-04-01: qty 1

## 2023-04-01 NOTE — ED Triage Notes (Signed)
Pt complaining of pain under the left ear into his jaw that started today. There is a little swelling there as well. Denies any dental pain

## 2023-04-01 NOTE — Discharge Instructions (Signed)
Please refer to the attached instructions. Take antibiotic as directed.

## 2023-04-01 NOTE — ED Notes (Signed)
Reviewed AVS with patient, patient expressed understanding of directions, denies further questions at this time. 

## 2023-04-01 NOTE — ED Provider Notes (Signed)
[facility] AT [facility] Provider Note   CSN: [id number] Arrival date & time: 04/01/23  2004     History {Add pertinent medical, surgical, social history, OB history to HPI:1} Chief Complaint  Patient presents with   Jaw Pain    Vincent Rodriguez is a 52 y.o. male.  Patient presents with swelling under the left ear extending onset today while eating. Painful when chewing. No difficulty swallowing. No sore throat. No ear pain. No dental pain. No URI symptoms.  The history is provided by the patient. No language interpreter was used.       Home Medications Prior to Admission medications   Medication Sig Start Date End Date Taking? Authorizing Provider  amLODipine (NORVASC) 10 MG tablet Take 10 mg by mouth daily.    [provider]  aspirin-acetaminophen-caffeine (EXCEDRIN MIGRAINE) [phone] MG per tablet Take 2 tablets by mouth daily as needed for headache.    [provider]  Multiple Vitamin (MULTIVITAMIN) tablet Take 1 tablet by mouth daily.    [provider]  pravastatin (PRAVACHOL) 20 MG tablet Take 20 mg by mouth daily.    [provider]  tamsulosin (FLOMAX) 0.4 MG CAPS capsule Take 1 capsule (0.4 mg total) by mouth daily. 02/17/23   Stoioff, Verna Czech, MD  testosterone (TESTIM) 50 MG/5GM (1%) GEL Place onto the skin.    [provider]      Allergies    Shrimp [shellfish allergy]    Review of Systems   Review of Systems  All other systems reviewed and are negative.   Physical Exam Updated Vital Signs BP 122/88   Pulse 65   Temp (!) 97.5 F (36.4 C) (Tympanic)   Resp 18   Ht 5\' 5"  (1.651 m)   Wt 77.1 kg   SpO2 98%   BMI 28.29 kg/m  Physical Exam Constitutional:      Appearance: Normal appearance.  HENT:     Head: Normocephalic.     Right Ear: Tympanic membrane normal.     Left Ear: Tympanic membrane normal.     Nose: Nose normal.     Mouth/Throat:     Mouth: Mucous  membranes are moist.  Eyes:     Conjunctiva/sclera: Conjunctivae normal.  Cardiovascular:     Rate and Rhythm: Normal rate.  Pulmonary:     Effort: Pulmonary effort is normal.  Musculoskeletal:        General: Normal range of motion.     Cervical back: Normal range of motion and neck supple.  Skin:    General: Skin is warm and dry.  Neurological:     Mental Status: He is alert and oriented to person, place, and time.  Psychiatric:        Mood and Affect: Mood normal.        Behavior: Behavior normal.     ED Results / Procedures / Treatments   Labs (all labs ordered are listed, but only abnormal results are displayed) Labs Reviewed - No data to display  EKG None  Radiology No results found.  Procedures Procedures  {Document cardiac monitor, telemetry assessment procedure when appropriate:1}  Medications Ordered in ED Medications  amoxicillin-clavulanate (AUGMENTIN) 875-125 MG per tablet 1 tablet (has no administration in time range)    ED Course/ Medical Decision Making/ A&P   {   Click here for ABCD2, HEART and other calculatorsREFRESH Note before signing :1}  Medical Decision Making Risk Prescription drug management.   Suspicion for sialadenitis. Will start on augmentin. Care instructions and return precautions provided   {Document critical care time when appropriate:1} {Document review of labs and clinical decision tools ie heart score, Chads2Vasc2 etc:1}  {Document your independent review of radiology images, and any outside records:1} {Document your discussion with family members, caretakers, and with consultants:1} {Document social determinants of health affecting pt's care:1} {Document your decision making why or why not admission, treatments were needed:1} Final Clinical Impression(s) / ED Diagnoses Final diagnoses:  Sialadenitis    Rx / DC Orders ED Discharge Orders          Ordered    amoxicillin-clavulanate  (AUGMENTIN) 875-125 MG tablet  Every 12 hours        04/01/23 2205

## 2023-05-22 ENCOUNTER — Other Ambulatory Visit: Payer: 59

## 2023-08-18 ENCOUNTER — Other Ambulatory Visit: Payer: 59 | Admitting: Urology

## 2023-08-18 ENCOUNTER — Other Ambulatory Visit: Payer: 59
